# Patient Record
Sex: Female | Born: 1955 | Race: White | Hispanic: Yes | Marital: Married | Smoking: Never smoker
Health system: Southern US, Community
[De-identification: ages and names within clinical notes are randomized; demographics above are authoritative.]

## PROBLEM LIST (undated history)

## (undated) DIAGNOSIS — Z801 Family history of malignant neoplasm of trachea, bronchus and lung: Secondary | ICD-10-CM

## (undated) DIAGNOSIS — Z808 Family history of malignant neoplasm of other organs or systems: Secondary | ICD-10-CM

## (undated) DIAGNOSIS — F32A Depression, unspecified: Secondary | ICD-10-CM

## (undated) DIAGNOSIS — F329 Major depressive disorder, single episode, unspecified: Secondary | ICD-10-CM

## (undated) DIAGNOSIS — Z800 Family history of malignant neoplasm of digestive organs: Secondary | ICD-10-CM

## (undated) DIAGNOSIS — F419 Anxiety disorder, unspecified: Secondary | ICD-10-CM

## (undated) DIAGNOSIS — M1990 Unspecified osteoarthritis, unspecified site: Secondary | ICD-10-CM

## (undated) DIAGNOSIS — E785 Hyperlipidemia, unspecified: Secondary | ICD-10-CM

## (undated) DIAGNOSIS — Z803 Family history of malignant neoplasm of breast: Secondary | ICD-10-CM

## (undated) HISTORY — PX: COLONOSCOPY: SHX174

## (undated) HISTORY — PX: DILATION AND CURETTAGE OF UTERUS: SHX78

## (undated) HISTORY — PX: ABDOMINAL HYSTERECTOMY: SHX81

## (undated) HISTORY — DX: Family history of malignant neoplasm of breast: Z80.3

## (undated) HISTORY — PX: CHOLECYSTECTOMY: SHX55

## (undated) HISTORY — DX: Family history of malignant neoplasm of trachea, bronchus and lung: Z80.1

## (undated) HISTORY — DX: Family history of malignant neoplasm of digestive organs: Z80.0

## (undated) HISTORY — DX: Family history of malignant neoplasm of other organs or systems: Z80.8

---

## 2016-06-21 NOTE — H&P (Signed)
Shannon Griffith  DICTATION # [id number] CSN# [id number]   Margarette Asal, MD 06/21/2016 11:47 AM

## 2016-06-22 NOTE — H&P (Signed)
NAMETAKIMA, Shannon Griffith                ACCOUNT NO.:  [id number]  MEDICAL RECORD NO.:  [id number]  LOCATION:                                 FACILITY:  PHYSICIAN:  Ralene Bathe. Matthew Saras, M.D.DATE OF BIRTH:  1956/01/17  DATE OF ADMISSION: DATE OF DISCHARGE:                             HISTORY & PHYSICAL   CHIEF COMPLAINT:  Symptomatic uterine prolapse.  HISTORY OF PRESENT ILLNESS:  A 61 year old postmenopausal patient, G4, P4, who was seen in August of this year with complaints of increased vaginal pressure and a sensation that something was coming out.  She has no issue.  At the time of exam, she had moderate uterine prolapse with moderate cystocele and a mild low rectocele.  No other abnormalities noted.  We discussed a number of options for surgical correction of POP. She prefers the outlined plan of LAVH-BSO with A and P repair.  This procedure including specific risks related to bleeding, infection, adjacent organ injury, and the possible need to complete the surgery by open technique discussed.  Other risks related to wound infection, phlebitis along with her expected recovery time reviewed, which she understands and accepts.  PAST MEDICAL HISTORY:  Allergies, none.  CURRENT MEDICATIONS:  Seroquel and Wellbutrin.  PAST SURGICAL HISTORY:  She has had 4 vaginal deliveries and a cholecystectomy.  FAMILY HISTORY:  Significant for asthma and unspecified cancer.  SOCIAL HISTORY:  She denies alcohol, tobacco, or drug use.  She is married.  Dr. Billey Chang is her PCP.  Pap and mammogram are up to date through her PCP.  PHYSICAL EXAMINATION:  VITAL SIGNS:  Temperature 98.2, blood pressure 130/80. HEENT:  Unremarkable. NECK:  Supple without masses. LUNGS:  Clear. CARDIOVASCULAR:  Regular rate and rhythm without murmurs, rubs, or gallops noted. BREASTS:  Without masses. ABDOMEN:  Soft, flat, nontender.  On straining, there is a moderate uterine prolapse.  Uterus is otherwise  normal size.  Moderate cystocele, low-grade low rectocele.  Adnexae otherwise unremarkable.  IMPRESSION:  Symptomatic uterine prolapse with cystocele and small rectocele.  PLAN:  LAVH-BSO, A and P repair.  Procedure and risks reviewed as above.     Don Giarrusso M. Matthew Saras, M.D.   ______________________________ Ralene Bathe. Matthew Saras, M.D.    [facility]/MEDQ  D:  06/21/2016  T:  06/22/2016  Job:  [id number]

## 2016-06-28 NOTE — Patient Instructions (Addendum)
Your procedure is scheduled on:  Tuesday, Nov. 28, 2017  Enter through the Main Entrance of [facility] at:  6:00 AM  Pick up the phone at the desk and dial [phone].  Call this number if you have problems the morning of surgery: [phone].  Remember: Do NOT eat food or drink after:  Midnight Monday, Nov. 27, 2017  Take these medicines the morning of surgery with a SIP OF WATER:  None  Stop ALL herbal medications at this time   Do NOT wear jewelry (body piercing), metal hair clips/bobby pins, make-up, or nail polish. Do NOT wear lotions, powders, or perfumes.  You may wear deodorant. Do NOT shave for 48 hours prior to surgery. Do NOT bring valuables to the hospital. Contacts, dentures, or bridgework may not be worn into surgery.  Leave suitcase in car.  After surgery it may be brought to your room.  For patients admitted to the hospital, checkout time is 11:00 AM the day of discharge.  Bring a copy of healthcare power of attorney paperwork day of surgery.

## 2016-06-29 ENCOUNTER — Encounter (HOSPITAL_COMMUNITY)
Admission: RE | Admit: 2016-06-29 | Discharge: 2016-06-29 | Disposition: A | Discharge status: Home / Self Care | Payer: BLUE CROSS/BLUE SHIELD | Source: Ambulatory Visit | Attending: Obstetrics and Gynecology | Admitting: Obstetrics and Gynecology

## 2016-06-29 ENCOUNTER — Encounter (HOSPITAL_COMMUNITY): Payer: Self-pay

## 2016-06-29 ENCOUNTER — Other Ambulatory Visit: Payer: Self-pay

## 2016-06-29 DIAGNOSIS — Z01812 Encounter for preprocedural laboratory examination: Secondary | ICD-10-CM | POA: Diagnosis present

## 2016-06-29 HISTORY — DX: Depression, unspecified: F32.A

## 2016-06-29 HISTORY — DX: Anxiety disorder, unspecified: F41.9

## 2016-06-29 HISTORY — DX: Major depressive disorder, single episode, unspecified: F32.9

## 2016-06-29 HISTORY — DX: Unspecified osteoarthritis, unspecified site: M19.90

## 2016-06-29 LAB — TYPE AND SCREEN
ABO/RH(D): O NEG
Antibody Screen: NEGATIVE

## 2016-06-29 LAB — ABO/RH: ABO/RH(D): O NEG

## 2016-07-11 MED ORDER — CEFOTETAN DISODIUM 2 G IJ SOLR
2.0000 g | INTRAMUSCULAR | Status: AC
  Administered 2016-07-12: 2 g via INTRAVENOUS
  Filled 2016-07-11: qty 2

## 2016-07-12 ENCOUNTER — Encounter (HOSPITAL_COMMUNITY)
Admission: RE | Disposition: A | Discharge status: Home / Self Care | Payer: Self-pay | Source: Ambulatory Visit | Attending: Obstetrics and Gynecology

## 2016-07-12 ENCOUNTER — Encounter (HOSPITAL_COMMUNITY): Payer: Self-pay

## 2016-07-12 ENCOUNTER — Ambulatory Visit (HOSPITAL_COMMUNITY): Payer: BLUE CROSS/BLUE SHIELD | Admitting: Anesthesiology

## 2016-07-12 ENCOUNTER — Observation Stay (HOSPITAL_COMMUNITY)
Admission: RE | Admit: 2016-07-12 | Discharge: 2016-07-13 | Disposition: A | Discharge status: Home / Self Care | Payer: BLUE CROSS/BLUE SHIELD | Source: Ambulatory Visit | Attending: Obstetrics and Gynecology | Admitting: Obstetrics and Gynecology

## 2016-07-12 DIAGNOSIS — I350 Nonrheumatic aortic (valve) stenosis: Secondary | ICD-10-CM | POA: Diagnosis not present

## 2016-07-12 DIAGNOSIS — N8189 Other female genital prolapse: Principal | ICD-10-CM | POA: Insufficient documentation

## 2016-07-12 DIAGNOSIS — D251 Intramural leiomyoma of uterus: Secondary | ICD-10-CM | POA: Diagnosis not present

## 2016-07-12 DIAGNOSIS — N814 Uterovaginal prolapse, unspecified: Secondary | ICD-10-CM | POA: Diagnosis not present

## 2016-07-12 DIAGNOSIS — N819 Female genital prolapse, unspecified: Secondary | ICD-10-CM | POA: Diagnosis present

## 2016-07-12 DIAGNOSIS — N838 Other noninflammatory disorders of ovary, fallopian tube and broad ligament: Secondary | ICD-10-CM | POA: Diagnosis not present

## 2016-07-12 DIAGNOSIS — M1990 Unspecified osteoarthritis, unspecified site: Secondary | ICD-10-CM | POA: Diagnosis not present

## 2016-07-12 DIAGNOSIS — Z9889 Other specified postprocedural states: Secondary | ICD-10-CM

## 2016-07-12 HISTORY — PX: ANTERIOR AND POSTERIOR REPAIR WITH SACROSPINOUS FIXATION: SHX6536

## 2016-07-12 HISTORY — PX: CYSTOSCOPY: SHX5120

## 2016-07-12 HISTORY — PX: LAPAROSCOPIC VAGINAL HYSTERECTOMY WITH SALPINGO OOPHORECTOMY: SHX6681

## 2016-07-12 SURGERY — HYSTERECTOMY, VAGINAL, LAPAROSCOPY-ASSISTED, WITH SALPINGO-OOPHORECTOMY
Anesthesia: General

## 2016-07-12 MED ORDER — PROMETHAZINE HCL 25 MG/ML IJ SOLN: 6.2500 mg | INTRAMUSCULAR | Status: DC | PRN

## 2016-07-12 MED ORDER — FLUORESCEIN SODIUM 10 % IV SOLN
INTRAVENOUS | Status: DC | PRN
Start: 2016-07-12 — End: 2016-07-12
  Administered 2016-07-12: 100 mg via INTRAVENOUS

## 2016-07-12 MED ORDER — BUPROPION HCL ER (XL) 300 MG PO TB24
300.0000 mg | ORAL_TABLET | Freq: Every day | ORAL | Status: DC
  Administered 2016-07-12: 300 mg via ORAL
  Filled 2016-07-12 (×2): qty 1

## 2016-07-12 MED ORDER — OXYCODONE-ACETAMINOPHEN 5-325 MG PO TABS
1.0000 | ORAL_TABLET | ORAL | Status: DC | PRN
  Administered 2016-07-13: 1 via ORAL
  Filled 2016-07-12: qty 1

## 2016-07-12 MED ORDER — STERILE WATER FOR IRRIGATION IR SOLN
Status: DC | PRN
  Administered 2016-07-12: 1000 mL

## 2016-07-12 MED ORDER — BUPIVACAINE HCL (PF) 0.25 % IJ SOLN
INTRAMUSCULAR | Status: AC
  Filled 2016-07-12: qty 30

## 2016-07-12 MED ORDER — HYDROMORPHONE HCL 1 MG/ML IJ SOLN
0.2500 mg | INTRAMUSCULAR | Status: DC | PRN
  Administered 2016-07-12 (×2): 0.5 mg via INTRAVENOUS

## 2016-07-12 MED ORDER — EPHEDRINE 5 MG/ML INJ
INTRAVENOUS | Status: AC
  Filled 2016-07-12: qty 10

## 2016-07-12 MED ORDER — KETOROLAC TROMETHAMINE 30 MG/ML IJ SOLN
INTRAMUSCULAR | Status: DC | PRN
Start: 2016-07-12 — End: 2016-07-12
  Administered 2016-07-12: 30 mg via INTRAVENOUS

## 2016-07-12 MED ORDER — MIDAZOLAM HCL 2 MG/2ML IJ SOLN
INTRAMUSCULAR | Status: DC | PRN
  Administered 2016-07-12: 2 mg via INTRAVENOUS

## 2016-07-12 MED ORDER — MORPHINE SULFATE 2 MG/ML IV SOLN
INTRAVENOUS | Status: DC
  Administered 2016-07-12 (×2): 5 mg via INTRAVENOUS
  Administered 2016-07-12: 2 mg via INTRAVENOUS
  Administered 2016-07-12: 11:00:00 via INTRAVENOUS
  Administered 2016-07-13: 2 mg via INTRAVENOUS
  Administered 2016-07-13: 1 mg via INTRAVENOUS
  Filled 2016-07-12: qty 25

## 2016-07-12 MED ORDER — KETOROLAC TROMETHAMINE 30 MG/ML IJ SOLN: 30.0000 mg | Freq: Four times a day (QID) | INTRAMUSCULAR | Status: DC

## 2016-07-12 MED ORDER — ONDANSETRON HCL 4 MG/2ML IJ SOLN: 4.0000 mg | Freq: Four times a day (QID) | INTRAMUSCULAR | Status: DC | PRN

## 2016-07-12 MED ORDER — NALOXONE HCL 0.4 MG/ML IJ SOLN: 0.4000 mg | INTRAMUSCULAR | Status: DC | PRN

## 2016-07-12 MED ORDER — PHENYLEPHRINE 40 MCG/ML (10ML) SYRINGE FOR IV PUSH (FOR BLOOD PRESSURE SUPPORT)
PREFILLED_SYRINGE | INTRAVENOUS | Status: AC
  Filled 2016-07-12: qty 10

## 2016-07-12 MED ORDER — ONDANSETRON HCL 4 MG PO TABS: 4.0000 mg | ORAL_TABLET | Freq: Four times a day (QID) | ORAL | Status: DC | PRN

## 2016-07-12 MED ORDER — ROCURONIUM BROMIDE 100 MG/10ML IV SOLN
INTRAVENOUS | Status: AC
  Filled 2016-07-12: qty 1

## 2016-07-12 MED ORDER — DEXAMETHASONE SODIUM PHOSPHATE 10 MG/ML IJ SOLN
INTRAMUSCULAR | Status: DC | PRN
  Administered 2016-07-12: 10 mg via INTRAVENOUS

## 2016-07-12 MED ORDER — LACTATED RINGERS IV SOLN
INTRAVENOUS | Status: DC
  Administered 2016-07-12 (×3): via INTRAVENOUS

## 2016-07-12 MED ORDER — PROPOFOL 10 MG/ML IV BOLUS
INTRAVENOUS | Status: DC | PRN
  Administered 2016-07-12: 150 mg via INTRAVENOUS

## 2016-07-12 MED ORDER — SUCCINYLCHOLINE CHLORIDE 20 MG/ML IJ SOLN
INTRAMUSCULAR | Status: DC | PRN
  Administered 2016-07-12: 50 mg via INTRAVENOUS

## 2016-07-12 MED ORDER — PROPOFOL 10 MG/ML IV BOLUS
INTRAVENOUS | Status: AC
  Filled 2016-07-12: qty 40

## 2016-07-12 MED ORDER — EPHEDRINE SULFATE 50 MG/ML IJ SOLN
INTRAMUSCULAR | Status: DC | PRN
  Administered 2016-07-12: 10 mg via INTRAVENOUS
  Administered 2016-07-12 (×2): 5 mg via INTRAVENOUS
  Administered 2016-07-12 (×2): 20 mg via INTRAVENOUS

## 2016-07-12 MED ORDER — KETOROLAC TROMETHAMINE 30 MG/ML IJ SOLN: 30.0000 mg | Freq: Once | INTRAMUSCULAR | Status: DC

## 2016-07-12 MED ORDER — BUPIVACAINE HCL (PF) 0.25 % IJ SOLN
INTRAMUSCULAR | Status: DC | PRN
  Administered 2016-07-12: 6 mL

## 2016-07-12 MED ORDER — DEXTROSE IN LACTATED RINGERS 5 % IV SOLN
INTRAVENOUS | Status: DC
  Administered 2016-07-12 (×2): via INTRAVENOUS

## 2016-07-12 MED ORDER — BUTORPHANOL TARTRATE 1 MG/ML IJ SOLN: 1.0000 mg | INTRAMUSCULAR | Status: DC | PRN

## 2016-07-12 MED ORDER — LIDOCAINE HCL (CARDIAC) 20 MG/ML IV SOLN
INTRAVENOUS | Status: DC | PRN
  Administered 2016-07-12: 30 mg via INTRAVENOUS

## 2016-07-12 MED ORDER — MIDAZOLAM HCL 2 MG/2ML IJ SOLN
INTRAMUSCULAR | Status: AC
  Filled 2016-07-12: qty 2

## 2016-07-12 MED ORDER — SCOPOLAMINE 1 MG/3DAYS TD PT72
1.0000 | MEDICATED_PATCH | Freq: Once | TRANSDERMAL | Status: AC
  Administered 2016-07-12: 1 via TRANSDERMAL

## 2016-07-12 MED ORDER — ESTRADIOL 0.1 MG/GM VA CREA
TOPICAL_CREAM | VAGINAL | Status: AC
  Filled 2016-07-12: qty 42.5

## 2016-07-12 MED ORDER — GLYCOPYRROLATE 0.2 MG/ML IJ SOLN
INTRAMUSCULAR | Status: DC | PRN
  Administered 2016-07-12: 0.2 mg via INTRAVENOUS

## 2016-07-12 MED ORDER — FAMOTIDINE 20 MG PO TABS
20.0000 mg | ORAL_TABLET | Freq: Two times a day (BID) | ORAL | Status: DC | PRN
  Administered 2016-07-12: 20 mg via ORAL
  Filled 2016-07-12: qty 1

## 2016-07-12 MED ORDER — ONDANSETRON HCL 4 MG/2ML IJ SOLN
INTRAMUSCULAR | Status: DC | PRN
  Administered 2016-07-12: 4 mg via INTRAVENOUS

## 2016-07-12 MED ORDER — KETOROLAC TROMETHAMINE 30 MG/ML IJ SOLN
INTRAMUSCULAR | Status: AC
  Filled 2016-07-12: qty 1

## 2016-07-12 MED ORDER — SUGAMMADEX SODIUM 200 MG/2ML IV SOLN
INTRAVENOUS | Status: AC
  Filled 2016-07-12: qty 2

## 2016-07-12 MED ORDER — IBUPROFEN 800 MG PO TABS: 800.0000 mg | ORAL_TABLET | Freq: Three times a day (TID) | ORAL | Status: DC | PRN

## 2016-07-12 MED ORDER — SODIUM CHLORIDE 0.9 % IJ SOLN
INTRAMUSCULAR | Status: AC
  Filled 2016-07-12: qty 10

## 2016-07-12 MED ORDER — MENTHOL 3 MG MT LOZG: 1.0000 | LOZENGE | OROMUCOSAL | Status: DC | PRN

## 2016-07-12 MED ORDER — DIPHENHYDRAMINE HCL 50 MG/ML IJ SOLN: 12.5000 mg | Freq: Four times a day (QID) | INTRAMUSCULAR | Status: DC | PRN

## 2016-07-12 MED ORDER — SODIUM CHLORIDE 0.9 % IJ SOLN
INTRAMUSCULAR | Status: DC | PRN
  Administered 2016-07-12: 10 mL

## 2016-07-12 MED ORDER — QUETIAPINE FUMARATE ER 50 MG PO TB24
150.0000 mg | ORAL_TABLET | Freq: Every day | ORAL | Status: DC
  Administered 2016-07-12: 150 mg via ORAL
  Filled 2016-07-12 (×2): qty 3

## 2016-07-12 MED ORDER — LIDOCAINE-EPINEPHRINE (PF) 1 %-1:200000 IJ SOLN
INTRAMUSCULAR | Status: DC | PRN
  Administered 2016-07-12: 6 mL

## 2016-07-12 MED ORDER — PHENYLEPHRINE HCL 10 MG/ML IJ SOLN
INTRAMUSCULAR | Status: DC | PRN
  Administered 2016-07-12 (×4): .04 mg via INTRAVENOUS

## 2016-07-12 MED ORDER — LIDOCAINE-EPINEPHRINE (PF) 1 %-1:200000 IJ SOLN
INTRAMUSCULAR | Status: AC
  Filled 2016-07-12: qty 30

## 2016-07-12 MED ORDER — FENTANYL CITRATE (PF) 250 MCG/5ML IJ SOLN
INTRAMUSCULAR | Status: AC
  Filled 2016-07-12: qty 5

## 2016-07-12 MED ORDER — ROCURONIUM BROMIDE 100 MG/10ML IV SOLN
INTRAVENOUS | Status: DC | PRN
  Administered 2016-07-12: 20 mg via INTRAVENOUS

## 2016-07-12 MED ORDER — SODIUM CHLORIDE 0.9% FLUSH: 9.0000 mL | INTRAVENOUS | Status: DC | PRN

## 2016-07-12 MED ORDER — SUGAMMADEX SODIUM 200 MG/2ML IV SOLN
INTRAVENOUS | Status: DC | PRN
  Administered 2016-07-12: 142.6 mg via INTRAVENOUS

## 2016-07-12 MED ORDER — SCOPOLAMINE 1 MG/3DAYS TD PT72
MEDICATED_PATCH | TRANSDERMAL | Status: AC
  Filled 2016-07-12: qty 1

## 2016-07-12 MED ORDER — FENTANYL CITRATE (PF) 100 MCG/2ML IJ SOLN
INTRAMUSCULAR | Status: DC | PRN
  Administered 2016-07-12: 100 ug via INTRAVENOUS
  Administered 2016-07-12: 50 ug via INTRAVENOUS

## 2016-07-12 MED ORDER — HYDROMORPHONE HCL 1 MG/ML IJ SOLN
INTRAMUSCULAR | Status: AC
  Administered 2016-07-12: 0.5 mg via INTRAVENOUS
  Filled 2016-07-12: qty 1

## 2016-07-12 MED ORDER — ESTRADIOL 0.1 MG/GM VA CREA
TOPICAL_CREAM | VAGINAL | Status: DC | PRN
  Administered 2016-07-12: 1 via VAGINAL

## 2016-07-12 MED ORDER — KETOROLAC TROMETHAMINE 30 MG/ML IJ SOLN
30.0000 mg | Freq: Four times a day (QID) | INTRAMUSCULAR | Status: DC
  Administered 2016-07-12 – 2016-07-13 (×4): 30 mg via INTRAVENOUS
  Filled 2016-07-12 (×4): qty 1

## 2016-07-12 MED ORDER — FLUORESCEIN SODIUM 10 % IV SOLN
INTRAVENOUS | Status: AC
  Filled 2016-07-12: qty 5

## 2016-07-12 MED ORDER — LIDOCAINE HCL (CARDIAC) 20 MG/ML IV SOLN
INTRAVENOUS | Status: AC
  Filled 2016-07-12: qty 5

## 2016-07-12 MED ORDER — DIPHENHYDRAMINE HCL 12.5 MG/5ML PO ELIX: 12.5000 mg | ORAL_SOLUTION | Freq: Four times a day (QID) | ORAL | Status: DC | PRN

## 2016-07-12 MED ORDER — FAMOTIDINE 40 MG/5ML PO SUSR: 20.0000 mg | Freq: Two times a day (BID) | ORAL | Status: DC | PRN

## 2016-07-12 SURGICAL SUPPLY — 64 items
BLADE SURG 11 STRL SS (BLADE) ×3 IMPLANT
BLADE SURG 15 STRL LF C SS BP (BLADE) IMPLANT
BLADE SURG 15 STRL SS (BLADE)
CABLE HIGH FREQUENCY MONO STRZ (ELECTRODE) IMPLANT
CATH ROBINSON RED A/P 16FR (CATHETERS) ×3 IMPLANT
CLOTH BEACON ORANGE TIMEOUT ST (SAFETY) ×3 IMPLANT
CONT PATH 16OZ SNAP LID 3702 (MISCELLANEOUS) ×3 IMPLANT
COUNTER NEEDLE 1200 MAGNETIC (NEEDLE) ×3 IMPLANT
COVER BACK TABLE 60X90IN (DRAPES) ×3 IMPLANT
DECANTER SPIKE VIAL GLASS SM (MISCELLANEOUS) ×9 IMPLANT
DERMABOND ADVANCED (GAUZE/BANDAGES/DRESSINGS) ×1
DERMABOND ADVANCED .7 DNX12 (GAUZE/BANDAGES/DRESSINGS) ×2 IMPLANT
DEVICE CAPIO SLIM SINGLE (INSTRUMENTS) ×3 IMPLANT
DRSG OPSITE POSTOP 3X4 (GAUZE/BANDAGES/DRESSINGS) IMPLANT
DURAPREP 26ML APPLICATOR (WOUND CARE) ×3 IMPLANT
ELECT LIGASURE SHORT 9 REUSE (ELECTRODE) ×3 IMPLANT
ELECT REM PT RETURN 9FT ADLT (ELECTROSURGICAL) ×3
ELECTRODE REM PT RTRN 9FT ADLT (ELECTROSURGICAL) ×2 IMPLANT
GAUZE PACKING 2X5 YD STRL (GAUZE/BANDAGES/DRESSINGS) ×3 IMPLANT
GLOVE BIO SURGEON STRL SZ7 (GLOVE) ×6 IMPLANT
GLOVE BIOGEL PI IND STRL 6.5 (GLOVE) ×2 IMPLANT
GLOVE BIOGEL PI IND STRL 7.0 (GLOVE) ×4 IMPLANT
GLOVE BIOGEL PI INDICATOR 6.5 (GLOVE) ×1
GLOVE BIOGEL PI INDICATOR 7.0 (GLOVE) ×2
GOWN STRL REUS W/TWL LRG LVL3 (GOWN DISPOSABLE) ×12 IMPLANT
LEGGING LITHOTOMY PAIR STRL (DRAPES) ×3 IMPLANT
NEEDLE HYPO 22GX1.5 SAFETY (NEEDLE) IMPLANT
NEEDLE INSUFFLATION 120MM (ENDOMECHANICALS) ×3 IMPLANT
NEEDLE MAYO 6 CRC TAPER PT (NEEDLE) ×3 IMPLANT
NS IRRIG 1000ML POUR BTL (IV SOLUTION) ×3 IMPLANT
PACK LAVH (CUSTOM PROCEDURE TRAY) ×3 IMPLANT
PACK ROBOTIC GOWN (GOWN DISPOSABLE) ×3 IMPLANT
PACK TRENDGUARD 450 HYBRID PRO (MISCELLANEOUS) ×2 IMPLANT
PACK TRENDGUARD 600 HYBRD PROC (MISCELLANEOUS) IMPLANT
PACK VAGINAL WOMENS (CUSTOM PROCEDURE TRAY) ×3 IMPLANT
PROTECTOR NERVE ULNAR (MISCELLANEOUS) ×6 IMPLANT
SEALER TISSUE G2 CVD JAW 45CM (ENDOMECHANICALS) ×3 IMPLANT
SET CYSTO W/LG BORE CLAMP LF (SET/KITS/TRAYS/PACK) ×3 IMPLANT
SET IRRIG TUBING LAPAROSCOPIC (IRRIGATION / IRRIGATOR) IMPLANT
SLEEVE XCEL OPT CAN 5 100 (ENDOMECHANICALS) ×3 IMPLANT
SUT CAPIO POLYGLYCOLIC (SUTURE) ×6 IMPLANT
SUT MON AB 2-0 CT1 36 (SUTURE) ×6 IMPLANT
SUT MON AB 3-0 SH 27 (SUTURE)
SUT MON AB 3-0 SH27 (SUTURE) IMPLANT
SUT VIC AB 0 CT1 18XCR BRD8 (SUTURE) ×6 IMPLANT
SUT VIC AB 0 CT1 36 (SUTURE) ×3 IMPLANT
SUT VIC AB 0 CT1 8-18 (SUTURE) ×3
SUT VIC AB 2-0 CT1 27 (SUTURE)
SUT VIC AB 2-0 CT1 TAPERPNT 27 (SUTURE) IMPLANT
SUT VIC AB 2-0 CT2 27 (SUTURE) ×18 IMPLANT
SUT VIC AB 4-0 PS2 27 (SUTURE) ×3 IMPLANT
SUT VICRYL 0 TIES 12 18 (SUTURE) ×3 IMPLANT
SUT VICRYL 0 UR6 27IN ABS (SUTURE) ×3 IMPLANT
SUT VICRYL 4-0 PS2 18IN ABS (SUTURE) ×3 IMPLANT
SUT VICRYL RAPIDE 3 0 (SUTURE) ×9 IMPLANT
SUT VICRYL RAPIDE 3-0 36IN (SUTURE) ×3 IMPLANT
SYR 30ML LL (SYRINGE) ×3 IMPLANT
TOWEL OR 17X24 6PK STRL BLUE (TOWEL DISPOSABLE) ×6 IMPLANT
TRAY FOLEY CATH SILVER 14FR (SET/KITS/TRAYS/PACK) ×3 IMPLANT
TRENDGUARD 450 HYBRID PRO PACK (MISCELLANEOUS) ×3
TRENDGUARD 600 HYBRID PROC PK (MISCELLANEOUS)
TROCAR OPTI TIP 5M 100M (ENDOMECHANICALS) ×3 IMPLANT
TROCAR XCEL DIL TIP R 11M (ENDOMECHANICALS) ×3 IMPLANT
WARMER LAPAROSCOPE (MISCELLANEOUS) ×3 IMPLANT

## 2016-07-12 NOTE — Anesthesia Preprocedure Evaluation (Addendum)
Anesthesia Evaluation  Patient identified by MRN, date of birth, ID band Patient awake    Reviewed: Allergy & Precautions, NPO status , reviewed documented beta blocker date and time   Airway Mallampati: II  TM Distance: >3 FB     Dental   Pulmonary    breath sounds clear to auscultation       Cardiovascular negative cardio ROS   Rhythm:Regular Rate:Normal     Neuro/Psych    GI/Hepatic negative GI ROS, Neg liver ROS,   Endo/Other  negative endocrine ROS  Renal/GU negative Renal ROS     Musculoskeletal  (+) Arthritis ,   Abdominal   Peds  Hematology   Anesthesia Other Findings   Reproductive/Obstetrics                            Anesthesia Physical Anesthesia Plan  ASA: II  Anesthesia Plan:    Post-op Pain Management:    Induction: Intravenous  Airway Management Planned: Oral ETT  Additional Equipment:   Intra-op Plan:   Post-operative Plan:   Informed Consent: I have reviewed the patients History and Physical, chart, labs and discussed the procedure including the risks, benefits and alternatives for the proposed anesthesia with the patient or authorized representative who has indicated his/her understanding and acceptance.   Dental advisory given  Plan Discussed with: CRNA and Anesthesiologist  Anesthesia Plan Comments:         Anesthesia Quick Evaluation

## 2016-07-12 NOTE — Progress Notes (Signed)
No change to exam>>aortic stenosis discussed in office last week , added poss SSLS to consent, along w/ cystoscopy

## 2016-07-12 NOTE — Anesthesia Procedure Notes (Signed)
Procedure Name: Intubation Date/Time: 07/12/2016 7:35 AM Performed by: Rayvon Char Pre-anesthesia Checklist: Patient identified, Emergency Drugs available, Suction available and Patient being monitored Patient Re-evaluated:Patient Re-evaluated prior to inductionOxygen Delivery Method: Circle system utilized Preoxygenation: Pre-oxygenation with 100% oxygen Intubation Type: IV induction Ventilation: Mask ventilation without difficulty Laryngoscope Size: Glidescope and 3 Grade View: Grade III Number of attempts: 1 Airway Equipment and Method: Video-laryngoscopy Placement Confirmation: ETT inserted through vocal cords under direct vision,  positive ETCO2 and breath sounds checked- equal and bilateral Secured at: 22 cm Tube secured with: Tape Dental Injury: Teeth and Oropharynx as per pre-operative assessment  Difficulty Due To: Difficult Airway- due to anterior larynx

## 2016-07-12 NOTE — Transfer of Care (Signed)
Immediate Anesthesia Transfer of Care Note  Patient: Avari Berkner  Procedure(s) Performed: Procedure(s): LAPAROSCOPIC ASSISTED VAGINAL HYSTERECTOMY WITH SALPINGO OOPHORECTOMY (Bilateral) ANTERIOR AND POSTERIOR REPAIR WITH SACROSPINOUS FIXATION (N/A) CYSTOSCOPY (N/A)  Patient Location: PACU  Anesthesia Type:General  Level of Consciousness: awake, alert  and oriented  Airway & Oxygen Therapy: Patient Spontanous Breathing and Patient connected to nasal cannula oxygen  Post-op Assessment: Report given to RN and Post -op Vital signs reviewed and stable  Post vital signs: Reviewed and stable  Last Vitals:  Vitals:   07/12/16 0607  BP: 117/75  Pulse: 77  Resp: 18  Temp: 36.5 C    Last Pain:  Vitals:   07/12/16 0607  TempSrc: Oral      Patients Stated Pain Goal: 3 ([id number])  Complications: No apparent anesthesia complications

## 2016-07-12 NOTE — Addendum Note (Signed)
Addendum  created 07/12/16 1303 by Raenette Rover, CRNA   Sign clinical note

## 2016-07-12 NOTE — Anesthesia Postprocedure Evaluation (Signed)
Anesthesia Post Note  Patient: Shannon Griffith  Procedure(s) Performed: Procedure(s) (LRB): LAPAROSCOPIC ASSISTED VAGINAL HYSTERECTOMY WITH SALPINGO OOPHORECTOMY (Bilateral) ANTERIOR AND POSTERIOR REPAIR WITH SACROSPINOUS FIXATION (N/A) CYSTOSCOPY (N/A)  Anesthesia Type: General Pain management: pain level controlled Respiratory status: spontaneous breathing Cardiovascular status: stable Anesthetic complications: no     Last Vitals:  Vitals:   07/12/16 1040 07/12/16 1048  BP:    Pulse: 75 79  Resp: 11 12  Temp:      Last Pain:  Vitals:   07/12/16 0607  TempSrc: Oral   Pain Goal: Patients Stated Pain Goal: 3 (07/12/16 [id number])               EDWARDS,Marlaya Turck

## 2016-07-12 NOTE — Anesthesia Postprocedure Evaluation (Signed)
Anesthesia Post Note  Patient: Shannon Griffith  Procedure(s) Performed: Procedure(s) (LRB): LAPAROSCOPIC ASSISTED VAGINAL HYSTERECTOMY WITH SALPINGO OOPHORECTOMY (Bilateral) ANTERIOR AND POSTERIOR REPAIR WITH SACROSPINOUS FIXATION (N/A) CYSTOSCOPY (N/A)  Patient location during evaluation: [facility] Anesthesia Type: General Level of consciousness: awake, awake and alert, oriented and patient cooperative Pain management: pain level controlled Vital Signs Assessment: post-procedure vital signs reviewed and stable Respiratory status: spontaneous breathing, nonlabored ventilation, respiratory function stable and patient connected to nasal cannula oxygen Cardiovascular status: stable Postop Assessment: no headache, no backache and no signs of nausea or vomiting Anesthetic complications: no     Last Vitals:  Vitals:   07/12/16 1200 07/12/16 1205  BP: (!) 97/49   Pulse: 76   Resp: 11 17  Temp: 36.6 C     Last Pain:  Vitals:   07/12/16 1205  TempSrc:   PainSc: 2    Pain Goal: Patients Stated Pain Goal: 3 (07/12/16 1100)               Autumnrose Yore L

## 2016-07-12 NOTE — Op Note (Signed)
Preoperative diagnosis: Pelvic organ prolapse, cystocele rectocele, uterine prolapse  Postoperative diagnosis: Same  Procedure: LAVH, BSO, anterior posterior repair, sacrospinous ligament fixation, cystoscopy  Surgeon: Matthew Saras  Assistant: McComb  EBL: 200 cc  Procedure and findings:  Patient taken the operating room after an adequate level of general anesthesia was obtained the patient's legs in stirrups the abdomen perineum and vagina were prepped and draped, the bladder was drained, moderate uterine prolapse was noted along with cystocele rectocele her bimanual exam was unremarkable. Hulka tenaculum was positioned. This was done after appropriate timeouts were taken.  Attention directed to the abdomen the subumbilical area was infiltrated with quarter percent Marcaine plain, the varies needle was introduced that difficulty after small incision was made. Its intra-abdominal position verified by pressure water testing. After a 3 L pneumoperitoneum syncopated, lap scopic trocar and sleeve were then introduced without difficulty. There was no evidence of any bleeding or trauma. 3 finger breaths above the symphysis in the midline a 5 mm trocar was inserted under direct visualization, the patient was then placed in Trendelenburg and the pelvic findings as follows:  The uterus itself was normal size, mobile anterior posterior cul-de-sac spaces were free and clear upper abdomen negative bilateral tubes and ovaries were normal. Atraumatic grasping forcep was then positioned placed in the right tube and ovary on traction toward the midline, the Enseal device was then used to coagulate and divide the right IP ligament down to and including the round ligament on the right after assuring that the ureter was well below. The exact same repeated on the opposite side. Once this was completed the vaginal portion the procedure was started  Legs were extended, weighted speculum was positioned the cervical vaginal  mucosa was incised, posterior culdotomy performed without difficulty, the bladder was advanced superiorly with sharp and blunt dissection until the anterior peritoneal reflection could be identified, this was then entered sharply and a retractor was then used to gently elevate the bladder out of the field. In sequential manner the uterosacral ligament cardinal ligament uterine vasculature pedicle and upper broad ligament pedicles were clamped divided and suture ligated with 0 Vicryl fundus of the uterus is in delivered posteriorly remaining pedicles were clamped divided and free tied. Once this was completed the vaginal cuff was enclose with a locked 2-0 Vicryl suture from 3 to 9:00 this was hemostatic McCall's culdoplasty suture was in position picking up left uterosacral ligament posterior peritoneum across to the right side and tied down for extra posterior support. Prior to closure sponge, needle, history counts reported as correct 2 vaginal mucosa then closed right to left with interrupted 2-0 Vicryl sutures. The UV angle was then grasped with a Allis clamp, the vaginal mucosa anteriorly was divided in the midline up to the UV angle. Using sharp and blunt dissection the perivesical fascia was separated from the vaginal mucosa and the cystocele was reduced. Perivesical fascia was then plicated in midline with interrupted 2-0 Vicryl sutures. Excess mucosa was trimmed and then closed with interrupted 2-0 Vicryl sutures. Posteriorly, a small triangle of perineal skin was excised the posterior mucosa was divided two thirds the way up to the cuff, using sharp and blunt dissection the rectocele was reduced, easy exposure of the right ischial spine and sacrospinous ligament area. The Area device with the Vicryl was then positioned 2 finger breaths from the initial spine and the suture was passed, this was anchored to the posterior mucosa near the cuff and held temporarily. The perirectal fascia was then  plicated in  the midline with 2-0 Vicryl sutures. A small amount of excess mucosa was then trimmed. The posterior vaginal Coso was then closed with interrupted 2-0 Vicryl sutures, several were placed at the top before tying down the 6 minus ligament suture with good elevation of the vaginal cuff. The remainder of the mucosa was then closed, the perineum closed with 3-0 Vicryl repeat sutures. She been given for seen IV, cystoscopy was then carried out revealing the bladder to be intact, good ureteral jets from both sides was noted, Foley catheter was placed straight drain vaginal pack was position.  Repeat laparoscopy was carried out at reduced pressure revealing the operative site to be hemostatic very minimal old blood, otherwise hemostatic. Inserts removed, gas allowed to escape, the umbilical incision closed with a 4-0 Monocryl subcuticular Dermabond on the lower she tolerated this well went to recovery room in good condition.  Dictated with Midway[employer].

## 2016-07-13 ENCOUNTER — Encounter (HOSPITAL_COMMUNITY): Payer: Self-pay

## 2016-07-13 ENCOUNTER — Observation Stay (HOSPITAL_COMMUNITY): Payer: BLUE CROSS/BLUE SHIELD

## 2016-07-13 DIAGNOSIS — N8189 Other female genital prolapse: Secondary | ICD-10-CM | POA: Diagnosis not present

## 2016-07-13 LAB — CBC
HEMATOCRIT: 31.9 % — AB (ref 36.0–46.0)
Hemoglobin: 10.4 g/dL — ABNORMAL LOW (ref 12.0–15.0)
MCH: 29 pg (ref 26.0–34.0)
MCHC: 32.6 g/dL (ref 30.0–36.0)
MCV: 88.9 fL (ref 78.0–100.0)
PLATELETS: 207 10*3/uL (ref 150–400)
RBC: 3.59 MIL/uL — AB (ref 3.87–5.11)
RDW: 13.7 % (ref 11.5–15.5)
WBC: 9.5 10*3/uL (ref 4.0–10.5)

## 2016-07-13 MED ORDER — IBUPROFEN 800 MG PO TABS: 800.0000 mg | ORAL_TABLET | Freq: Three times a day (TID) | ORAL | 1 refills | Status: DC | PRN

## 2016-07-13 MED ORDER — OXYCODONE-ACETAMINOPHEN 5-325 MG PO TABS: 1.0000 | ORAL_TABLET | ORAL | 0 refills | Status: DC | PRN

## 2016-07-13 NOTE — Progress Notes (Signed)
Wasted 9ml of 2mg /33ml morphine in sink. Witnessed by Vonzella Nipple RN.

## 2016-07-13 NOTE — Discharge Summary (Signed)
Physician Discharge Summary  Patient ID: Shannon Griffith MRN: [id number] DOB/AGE: 03-08-1956 60 y.o.  Admit date: 07/12/2016 Discharge date: 07/13/2016  Admission Diagnoses:POP, uterine prolapse/cystocele/rectocele  Discharge Diagnoses: same Active Problems:   Pelvic prolapse   Discharged Condition: good  Hospital Course: adm for LAVH BSO A+P repair and SSLS w/ cysto>>on POD #1, cystogram WNL and foley D/C, afeb, tol PO and ready for D/C  Consults: None  Significant Diagnostic Studies: labs:  CBC    Component Value Date/Time   WBC 9.5 07/13/2016 0605   RBC 3.59 (L) 07/13/2016 0605   HGB 10.4 (L) 07/13/2016 0605   HCT 31.9 (L) 07/13/2016 0605   PLT 207 07/13/2016 0605   MCV 88.9 07/13/2016 0605   MCH 29.0 07/13/2016 0605   MCHC 32.6 07/13/2016 0605   RDW 13.7 07/13/2016 0605     Treatments: surgery: LAVH BSO, A and P repair,SSLS, cystoscopy  Discharge Exam: Blood pressure 120/63, pulse 70, temperature 98 F (36.7 C), temperature source Oral, resp. rate 18, height 5\' 6"  (1.676 m), weight 69.4 kg (153 lb), SpO2 98 %. abd soft, flat + BS, Inc C/D  Disposition: Final discharge disposition not confirmed     Medication List    TAKE these medications   buPROPion 300 MG 24 hr tablet Commonly known as:  WELLBUTRIN XL Take 300 mg by mouth at bedtime.   ibuprofen 800 MG tablet Commonly known as:  ADVIL,MOTRIN Take 1 tablet (800 mg total) by mouth every 8 (eight) hours as needed for moderate pain (mild pain). Start taking on:  07/17/2016   oxyCODONE-acetaminophen 5-325 MG tablet Commonly known as:  PERCOCET/ROXICET Take 1-2 tablets by mouth every 4 (four) hours as needed for severe pain (moderate to severe pain (when tolerating fluids)).   QUEtiapine Fumarate 150 MG 24 hr tablet Commonly known as:  SEROQUEL XR Take 150 mg by mouth at bedtime.      Follow-up Information    Margarette Asal, MD. Schedule an appointment as soon as possible for a visit in 1 week(s).    Specialty:  Obstetrics and Gynecology Contact information: [address] [place] [address] [phone]           Signed: Margarette Asal 07/13/2016, 1:13 PM

## 2016-07-13 NOTE — Progress Notes (Signed)
Patient discharged home with daughter and husband. Discharge paperwork, instructions, follow-up appts, and prescriptions reviewed with patient and daughter. No questions at this time. Prescriptions given to patient.

## 2016-07-13 NOTE — Progress Notes (Signed)
Cystogram WNL>>cath removed for voiding trial, if voiding OK>>will D/C

## 2017-05-12 IMAGING — RF DG CYSTOGRAM 3+V
14 series · 14 of 14 positions shown · non-contrast
Comparison: None

CLINICAL DATA: Post hysterectomy.  Evaluate bladder integrity.

EXAM:
CYSTOGRAM
TECHNIQUE: After catheterization of the urinary bladder following sterile
technique the bladder was filled with 250 mL Cysto-Hypaque 30% by
drip infusion. Serial spot images were obtained during bladder
filling and post draining.
FLUOROSCOPY TIME:  Fluoroscopy Time:  1 minutes
Radiation Exposure Index (if provided by the fluoroscopic device):
Number of Acquired Spot Images: 8

[Series 1: run · 1 of 1 slices shown (1 of 14)]
[im 1/1]
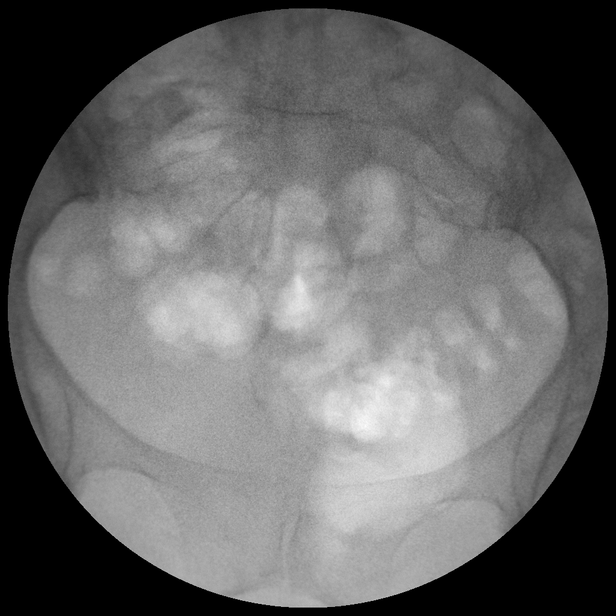

[Series 2: run · 1 of 1 slices shown (2 of 14)]
[im 1/1]
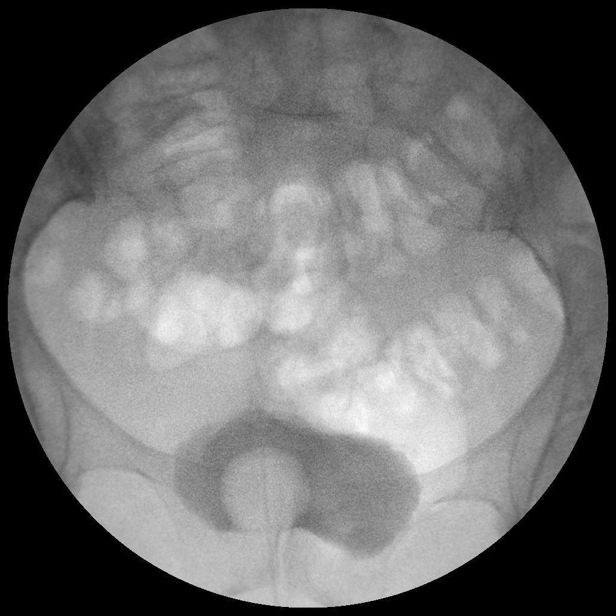

[Series 3: run · 1 of 1 slices shown (3 of 14)]
[im 1/1]
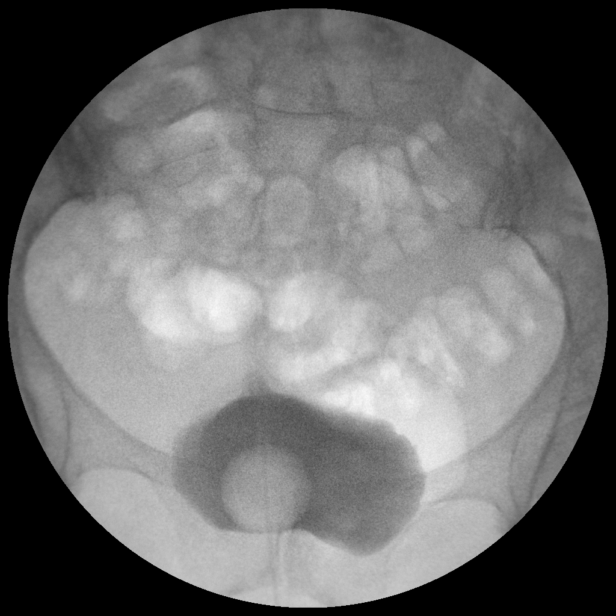

[Series 4: run · 1 of 1 slices shown (4 of 14)]
[im 1/1]
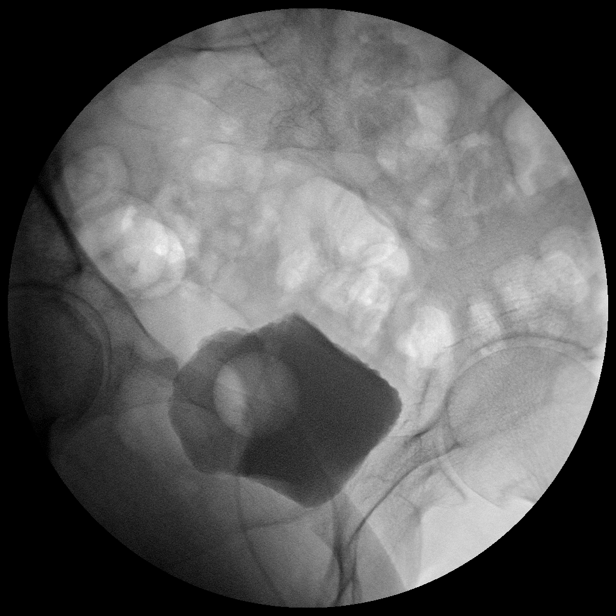

[Series 5: run · 1 of 1 slices shown (5 of 14)]
[im 1/1]
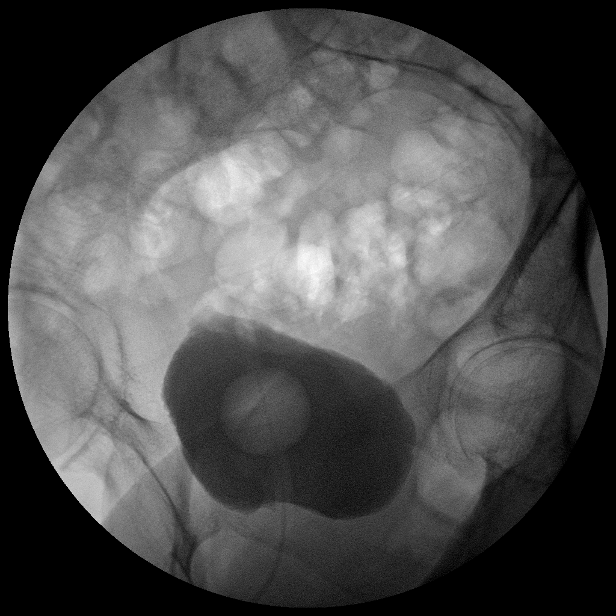

[Series 6: run · 1 of 1 slices shown (6 of 14)]
[im 1/1]
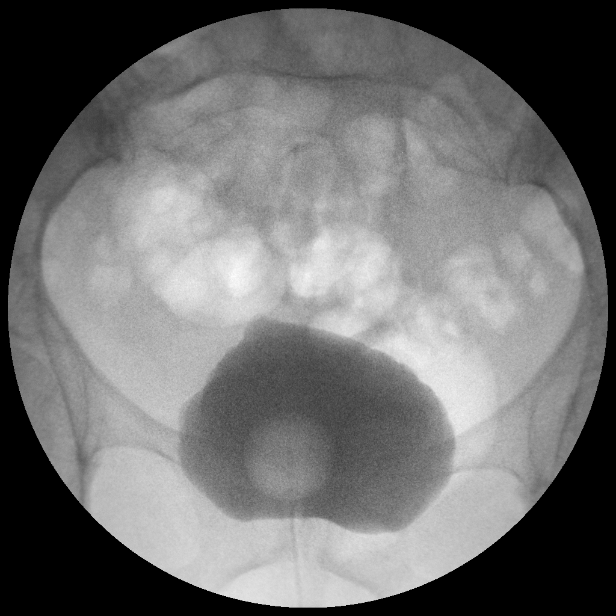

[Series 7: run · 1 of 1 slices shown (7 of 14)]
[im 1/1]
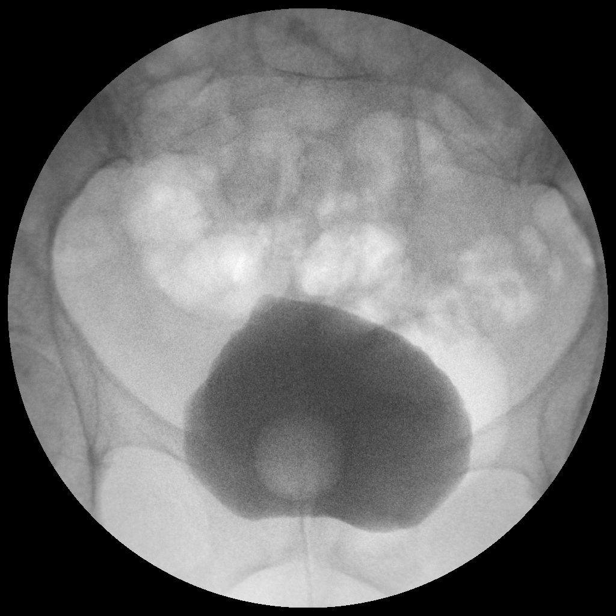

[Series 8: run · 1 of 1 slices shown (8 of 14)]
[im 1/1]
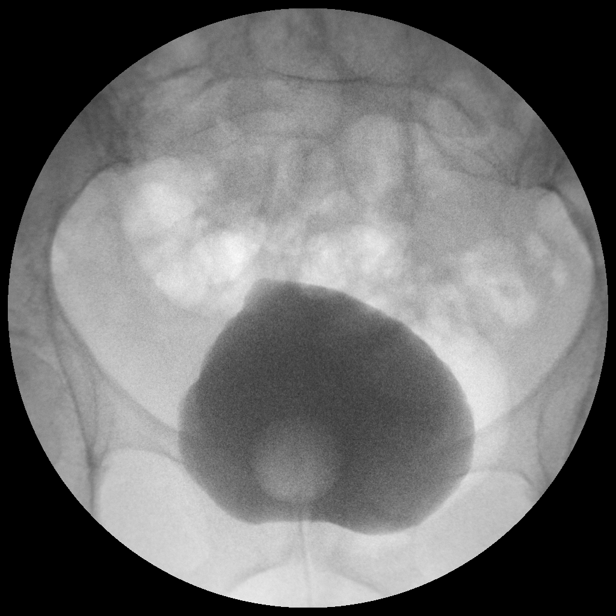

[Series 9: run · 1 of 1 slices shown (9 of 14)]
[im 1/1]
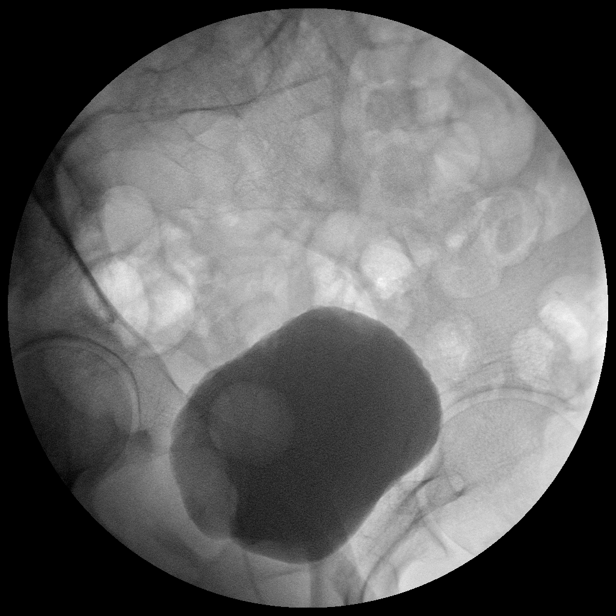

[Series 10: run · 1 of 1 slices shown (10 of 14)]
[im 1/1]
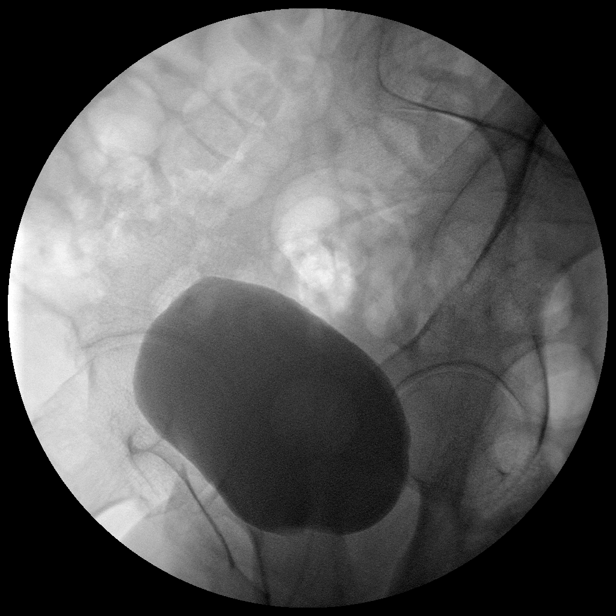

[Series 11: run · 1 of 1 slices shown (11 of 14)]
[im 1/1]
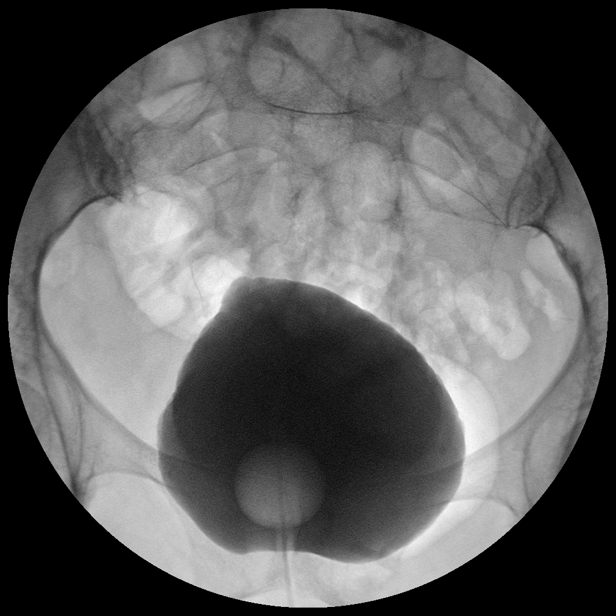

[Series 12: run · 1 of 1 slices shown (12 of 14)]
[im 1/1]
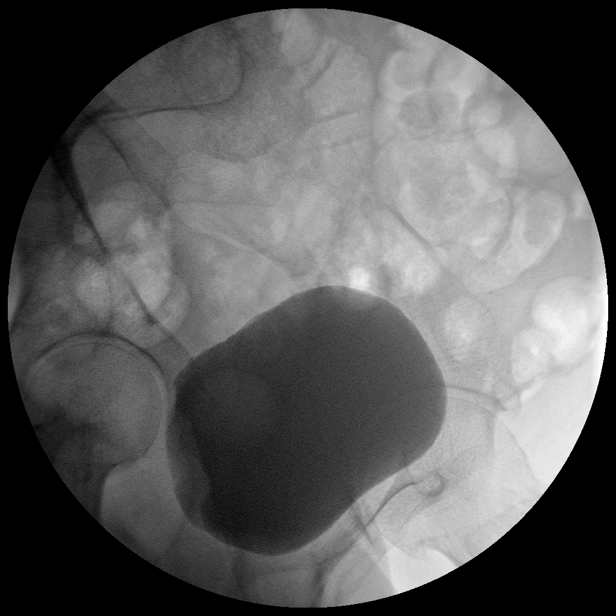

[Series 13: run · 1 of 1 slices shown (13 of 14)]
[im 1/1]
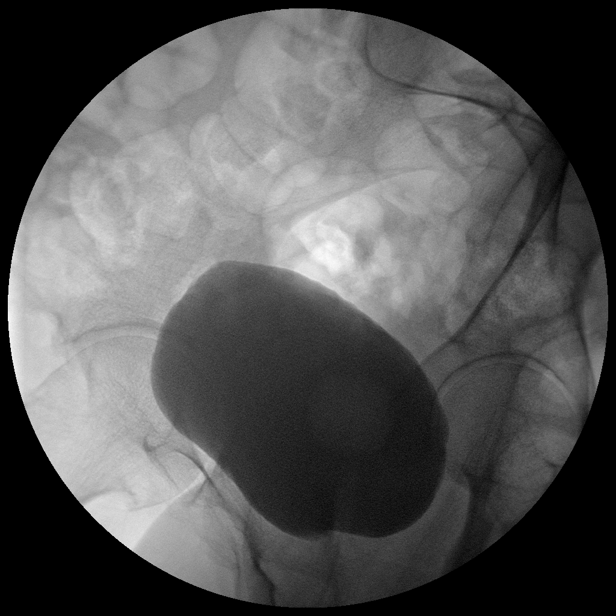

[Series 14: run · 1 of 1 slices shown (14 of 14)]
[im 1/1]
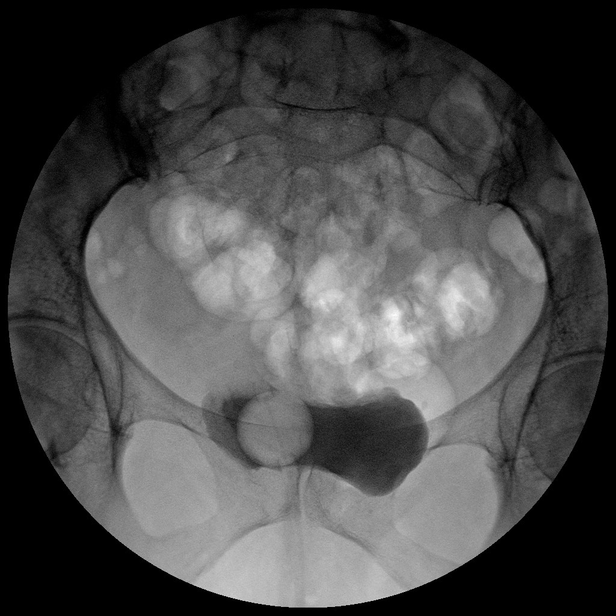

[14 of 14 positions shown; findings below may reference images not displayed]

FINDINGS: Bladder appears normal. No evidence of bladder leak for other wall
abnormality.
IMPRESSION: No evidence of leak.

## 2020-05-15 DIAGNOSIS — C801 Malignant (primary) neoplasm, unspecified: Secondary | ICD-10-CM

## 2020-05-15 HISTORY — DX: Malignant (primary) neoplasm, unspecified: C80.1

## 2020-05-20 ENCOUNTER — Other Ambulatory Visit: Payer: Self-pay

## 2020-05-25 ENCOUNTER — Ambulatory Visit: Payer: Self-pay | Admitting: Surgery

## 2020-05-25 DIAGNOSIS — C50912 Malignant neoplasm of unspecified site of left female breast: Secondary | ICD-10-CM

## 2020-05-25 NOTE — H&P (Signed)
Shannon Griffith Appointment: 05/25/2020 3:00 PM Location: [facility] Patient #: [id number] DOB: [birth year] Married / Language: [name] / Race: White Female  History of Present Illness Shannon Moores A. Malic Rosten MD; 05/25/2020 3:39 PM) Patient words: Patient presents for consultation to evaluate left breast abnormal mammogram. She was sent at the request of Dr. Luan Griffith. 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 Receptors are currently pending. 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 Her daughter had breast cancer at age 5 and was recently treated 2 years ago. She denies any history of breast pain, nipple discharge or breast mass. She gets yearly mammograms.  The patient is a 64 year old female.   Past Surgical History Shannon Griffith, CMA; 05/25/2020 3:06 PM) Breast Biopsy Bilateral. Gallbladder Surgery - Open Hysterectomy (not due to cancer) - Complete Tonsillectomy  Diagnostic Studies History Shannon Griffith, CMA; 05/25/2020 3:06 PM) Colonoscopy 1-5 years ago Mammogram within last year Pap Smear 1-5 years ago  Allergies Shannon Griffith, CMA; 05/25/2020 3:06 PM) No Known Drug Allergies [05/25/2020]:  Medication History Shannon Griffith, CMA; 05/25/2020 3:07 PM) Rosuvastatin Calcium (10MG  Tablet, Oral) Active. Wellbutrin XL (300MG  Tablet ER 24HR, Oral) Active. SEROquel XR (150MG  Tablet ER 24HR, Oral) Active. Medications Reconciled  Social History Shannon Griffith, CMA; 05/25/2020 3:06 PM) Alcohol use Occasional alcohol use. Caffeine use Coffee. No drug use Tobacco use Never smoker.  Family History Shannon Griffith, CMA; 05/25/2020 3:06 PM) Breast Cancer Daughter. Cancer Brother. Depression Mother. Melanoma Brother. Migraine Headache Mother. Respiratory Condition Father.  Pregnancy / Birth History  Shannon Griffith, CMA; 05/25/2020 3:06 PM) Age at menarche 49 years. Age of menopause 33-55 Gravida 4 Irregular periods Length (months) of breastfeeding 7-12 Maternal age 51-20 Para 4  Other Problems Shannon Griffith, CMA; 05/25/2020 3:06 PM) Bladder Problems Depression     Review of Systems (Shannon Baumgardner A. Lokelani Lutes MD; 05/25/2020 3:39 PM) General Not Present- Appetite Loss, Chills, Fatigue, Fever, Night Sweats, Weight Gain and Weight Loss. Skin Not Present- Change in Wart/Mole, Dryness, Hives, Jaundice, New Lesions, Non-Healing Wounds, Rash and Ulcer. HEENT Not Present- Earache, Hearing Loss, Hoarseness, Nose Bleed, Oral Ulcers, Ringing in the Ears, Seasonal Allergies, Sinus Pain, Sore Throat, Visual Disturbances, Wears glasses/contact lenses and Yellow Eyes. Respiratory Not Present- Bloody sputum, Chronic Cough, Difficulty Breathing, Snoring and Wheezing. Breast Not Present- Breast Mass, Breast Pain, Nipple Discharge and Skin Changes. Cardiovascular Not Present- Chest Pain, Difficulty Breathing Lying Down, Leg Cramps, Palpitations, Rapid Heart Rate, Shortness of Breath and Swelling of Extremities. Gastrointestinal Not Present- Abdominal Pain, Bloating, Bloody Stool, Change in Bowel Habits, Chronic diarrhea, Constipation, Difficulty Swallowing, Excessive gas, Gets full quickly at meals, Hemorrhoids, Indigestion, Nausea, Rectal Pain and Vomiting. Female Genitourinary Not Present- Frequency, Nocturia, Painful Urination, Pelvic Pain and Urgency. Musculoskeletal Not Present- Back Pain, Joint Pain, Joint Stiffness, Muscle Pain, Muscle Weakness and Swelling of Extremities. Neurological Not Present- Decreased Memory, Fainting, Headaches, Numbness, Seizures, Tingling, Tremor, Trouble walking and Weakness. Psychiatric Present- Depression. Not Present- Anxiety, Bipolar, Change in Sleep Pattern, Fearful and Frequent crying. Endocrine Not Present- Cold Intolerance, Excessive Hunger, Hair  Changes, Heat Intolerance, Hot flashes and New Diabetes. Hematology Not Present- Blood Thinners, Easy Bruising, Excessive bleeding, Gland problems, HIV and Persistent Infections. All other systems negative  Vitals [employer] R. Griffith CMA; 05/25/2020 3:06 PM) 05/25/2020 3:06 PM Weight: 165.38 lb Height: 66in Body Surface Area:  1.84 m Body Mass Index: 26.69 kg/m  Pulse: 115 (Regular)  BP: 138/84(Sitting, Left Arm, Standard)        Physical Exam (Shannon Struve A. Marguita Venning MD; 05/25/2020 3:39 PM)  General Mental Status-Alert. General Appearance-Consistent with stated age. Hydration-Well hydrated. Voice-Normal.  Head and Neck Head-normocephalic, atraumatic with no lesions or palpable masses. Trachea-midline. Thyroid Gland Characteristics - normal size and consistency.  Eye Eyeball - Bilateral-Extraocular movements intact. Sclera/Conjunctiva - Bilateral-No scleral icterus.  Chest and Lung Exam Chest and lung exam reveals -quiet, even and easy respiratory effort with no use of accessory muscles and on auscultation, normal breath sounds, no adventitious sounds and normal vocal resonance. Inspection Chest Wall - Normal. Back - normal.  Breast Breast - Left-Symmetric, Non Tender, No Biopsy scars, no Dimpling - Left, No Inflammation, No Lumpectomy scars, No Mastectomy scars, No Peau d' Orange. Breast - Right-Symmetric, Non Tender, No Biopsy scars, no Dimpling - Right, No Inflammation, No Lumpectomy scars, No Mastectomy scars, No Peau d' Orange. Breast Lump-No Palpable Breast Mass.  Cardiovascular Cardiovascular examination reveals -normal heart sounds, regular rate and rhythm with no murmurs and normal pedal pulses bilaterally.  Abdomen Inspection Inspection of the abdomen reveals - No Hernias. Skin - Scar - no surgical scars. Palpation/Percussion Palpation and Percussion of the abdomen reveal - Soft, Non Tender, No Rebound tenderness, No Rigidity  (guarding) and No hepatosplenomegaly. Auscultation Auscultation of the abdomen reveals - Bowel sounds normal.  Neurologic Neurologic evaluation reveals -alert and oriented x 3 with no impairment of recent or remote memory. Mental Status-Normal.  Musculoskeletal Normal Exam - Left-Upper Extremity Strength Normal and Lower Extremity Strength Normal. Normal Exam - Right-Upper Extremity Strength Normal and Lower Extremity Strength Normal.  Lymphatic Head & Neck  General Head & Neck Lymphatics: Bilateral - Description - Normal. Axillary  General Axillary Region: Bilateral - Description - Normal. Tenderness - Non Tender. Femoral & Inguinal  Generalized Femoral & Inguinal Lymphatics: Bilateral - Description - Normal. Tenderness - Non Tender.    Assessment & Plan (Acquanetta Cabanilla A. Mindy Behnken MD; 05/25/2020 3:41 PM)  BREAST CANCER, STAGE 1, LEFT (C50.912) Impression: Discussed breast conserving surgery with sentinel lymph node mapping versus mastectomy with reconstruction. Refer to medical and radiation oncology. May consider genetics given all of her with breast cancer. Discussed breast conserving surgery and she is opted to do so. Discussed lumpectomy received localization, sentinel lymph node mapping and the risks and benefits of this technique versus mastectomy with reconstruction. Discussed risk of lymphedema, cosmesis, sterile recovery time, and complications of the procedure. Risk of lumpectomy include bleeding, infection, seroma, more surgery, use of seed/wire, wound care, cosmetic deformity and the need for other treatments, death , blood clots, death. Pt agrees to proceed. Risk of sentinel lymph node mapping include bleeding, infection, lymphedema, shoulder pain. stiffness, dye allergy. cosmetic deformity , blood clots, death, need for more surgery. Pt agrees to proceed.   Total time 45 minutes   MALIGNANT NEOPLASM OF LEFT BREAST, STAGE 1, UNSPECIFIED ESTROGEN RECEPTOR STATUS  (C50.912)  Current Plans You are being scheduled for surgery- Our schedulers will call you.  You should hear from our office's scheduling department within 5 working days about the location, date, and time of surgery. We try to make accommodations for patient's preferences in scheduling surgery, but sometimes the OR schedule or the surgeon's schedule prevents [place] from making those accommodations.  If you have not heard from our office [phone]) in 5 working days, call the office and ask for your surgeon's nurse.  If you have other questions about your diagnosis, plan, or surgery, call the office and ask for your surgeon's nurse.  Pt Education - [facility] Breast Cancer Information Given - Alight "Breast Journey" Package We discussed the staging and pathophysiology of breast cancer. We discussed all of the different options for treatment for breast cancer including surgery, chemotherapy, radiation therapy, Herceptin, and antiestrogen therapy. We discussed a sentinel lymph node biopsy as she does not appear to having lymph node involvement right now. We discussed the performance of that with injection of radioactive tracer and blue dye. We discussed that she would have an incision underneath her axillary hairline. We discussed that there is a bout a 10-20% chance of having a positive node with a sentinel lymph node biopsy and we will await the permanent pathology to make any other first further decisions in terms of her treatment. One of these options might be to return to the operating room to perform an axillary lymph node dissection. We discussed about a 1-2% risk lifetime of chronic shoulder pain as well as lymphedema associated with a sentinel lymph node biopsy. We discussed the options for treatment of the breast cancer which included lumpectomy versus a mastectomy. We discussed the performance of the lumpectomy with a wire placement. We discussed a 10-20% chance of a positive margin requiring  reexcision in the operating room. We also discussed that she may need radiation therapy or antiestrogen therapy or both if she undergoes lumpectomy. We discussed the mastectomy and the postoperative care for that as well. We discussed that there is no difference in her survival whether she undergoes lumpectomy with radiation therapy or antiestrogen therapy versus a mastectomy. There is a slight difference in the local recurrence rate being 3-5% with lumpectomy and about 1% with a mastectomy. We discussed the risks of operation including bleeding, infection, possible reoperation. She understands her further therapy will be based on what her stages at the time of her operation.  Pt Education - breast cancer surgery: discussed with patient and provided information. Pt Education - [facility] Breast Biopsy HCI: discussed with patient and provided information. Pt Education - ABC (After Breast Cancer) Class Info: discussed with patient and provided information.

## 2020-05-25 NOTE — H&P (View-Only) (Signed)
Shannon Griffith Appointment: 05/25/2020 3:00 PM Location: [facility] Patient #: [id number] DOB: [birth year] Married / Language: Shannon Griffith / Race: White Female  History of Present Illness [name] A. Hoby Kawai MD; 05/25/2020 3:39 PM) Patient words: Patient presents for consultation to evaluate left breast abnormal mammogram. She was sent at the request of Dr. Luan Pulling. 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 Receptors are currently pending. 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 Her daughter had breast cancer at age 78 and was recently treated 2 years ago. She denies any history of breast pain, nipple discharge or breast mass. She gets yearly mammograms.  The patient is a 64 year old female.   Past Surgical History [name], CMA; 05/25/2020 3:06 PM) Breast Biopsy Bilateral. Gallbladder Surgery - Open Hysterectomy (not due to cancer) - Complete Tonsillectomy  Diagnostic Studies History Sharyn Lull R. [name], CMA; 05/25/2020 3:06 PM) Colonoscopy 1-5 years ago Mammogram within last year Pap Smear 1-5 years ago  Allergies [name], CMA; 05/25/2020 3:06 PM) No Known Drug Allergies [05/25/2020]:  Medication History [name], CMA; 05/25/2020 3:07 PM) Rosuvastatin Calcium (10MG  Tablet, Oral) Active. Wellbutrin XL (300MG  Tablet ER 24HR, Oral) Active. SEROquel XR (150MG  Tablet ER 24HR, Oral) Active. Medications Reconciled  Social History [name], CMA; 05/25/2020 3:06 PM) Alcohol use Occasional alcohol use. Caffeine use Coffee. No drug use Tobacco use Never smoker.  Family History Sharyn Lull R. [name], CMA; 05/25/2020 3:06 PM) Breast Cancer Daughter. Cancer Brother. Depression Mother. Melanoma Brother. Migraine Headache Mother. Respiratory Condition Father.  Pregnancy / Birth History  Sharyn Lull R. [name], CMA; 05/25/2020 3:06 PM) Age at menarche 59 years. Age of menopause 57-55 Gravida 4 Irregular periods Length (months) of breastfeeding 7-12 Maternal age 101-20 Para 4  Other Problems Sharyn Lull R. [name], CMA; 05/25/2020 3:06 PM) Bladder Problems Depression     Review of Systems (Cord Wilczynski A. Nickalos Petersen MD; 05/25/2020 3:39 PM) General Not Present- Appetite Loss, Chills, Fatigue, Fever, Night Sweats, Weight Gain and Weight Loss. Skin Not Present- Change in Wart/Mole, Dryness, Hives, Jaundice, New Lesions, Non-Healing Wounds, Rash and Ulcer. HEENT Not Present- Earache, Hearing Loss, Hoarseness, Nose Bleed, Oral Ulcers, Ringing in the Ears, Seasonal Allergies, Sinus Pain, Sore Throat, Visual Disturbances, Wears glasses/contact lenses and Yellow Eyes. Respiratory Not Present- Bloody sputum, Chronic Cough, Difficulty Breathing, Snoring and Wheezing. Breast Not Present- Breast Mass, Breast Pain, Nipple Discharge and Skin Changes. Cardiovascular Not Present- Chest Pain, Difficulty Breathing Lying Down, Leg Cramps, Palpitations, Rapid Heart Rate, Shortness of Breath and Swelling of Extremities. Gastrointestinal Not Present- Abdominal Pain, Bloating, Bloody Stool, Change in Bowel Habits, Chronic diarrhea, Constipation, Difficulty Swallowing, Excessive gas, Gets full quickly at meals, Hemorrhoids, Indigestion, Nausea, Rectal Pain and Vomiting. Female Genitourinary Not Present- Frequency, Nocturia, Painful Urination, Pelvic Pain and Urgency. Musculoskeletal Not Present- Back Pain, Joint Pain, Joint Stiffness, Muscle Pain, Muscle Weakness and Swelling of Extremities. Neurological Not Present- Decreased Memory, Fainting, Headaches, Numbness, Seizures, Tingling, Tremor, Trouble walking and Weakness. Psychiatric Present- Depression. Not Present- Anxiety, Bipolar, Change in Sleep Pattern, Fearful and Frequent crying. Endocrine Not Present- Cold Intolerance, Excessive Hunger, Hair  Changes, Heat Intolerance, Hot flashes and New Diabetes. Hematology Not Present- Blood Thinners, Easy Bruising, Excessive bleeding, Gland problems, HIV and Persistent Infections. All other systems negative  [employer] R. Brooks CMA; 05/25/2020 3:06 PM) 05/25/2020 3:06 PM Weight: 165.38 lb Height: 66in Body Surface Area:  1.84 m Body Mass Index: 26.69 kg/m  Pulse: 115 (Regular)  BP: 138/84(Sitting, Left Arm, Standard)        Physical Exam ([name] MD; 05/25/2020 3:39 PM)  General Mental Status-Alert. General Appearance-Consistent with stated age. Hydration-Well hydrated. Voice-Normal.  Head and Neck Head-normocephalic, atraumatic with no lesions or palpable masses. Trachea-midline. Thyroid Gland Characteristics - normal size and consistency.  Eye Eyeball - Bilateral-Extraocular movements intact. Sclera/Conjunctiva - Bilateral-No scleral icterus.  Chest and Lung Exam Chest and lung exam reveals -quiet, even and easy respiratory effort with no use of accessory muscles and on auscultation, normal breath sounds, no adventitious sounds and normal vocal resonance. Inspection Chest Wall - Normal. Back - normal.  Breast Breast - Left-Symmetric, Non Tender, No Biopsy scars, no Dimpling - Left, No Inflammation, No Lumpectomy scars, No Mastectomy scars, No Peau d' Orange. Breast - Right-Symmetric, Non Tender, No Biopsy scars, no Dimpling - Right, No Inflammation, No Lumpectomy scars, No Mastectomy scars, No Peau d' Orange. Breast Lump-No Palpable Breast Mass.  Cardiovascular Cardiovascular examination reveals -normal heart sounds, regular rate and rhythm with no murmurs and normal pedal pulses bilaterally.  Abdomen Inspection Inspection of the abdomen reveals - No Hernias. Skin - Scar - no surgical scars. Palpation/Percussion Palpation and Percussion of the abdomen reveal - Soft, Non Tender, No Rebound tenderness, No Rigidity  (guarding) and No hepatosplenomegaly. Auscultation Auscultation of the abdomen reveals - Bowel sounds normal.  Neurologic Neurologic evaluation reveals -alert and oriented x 3 with no impairment of recent or remote memory. Mental Status-Normal.  Musculoskeletal Normal Exam - Left-Upper Extremity Strength Normal and Lower Extremity Strength Normal. Normal Exam - Right-Upper Extremity Strength Normal and Lower Extremity Strength Normal.  Lymphatic Head & Neck  General Head & Neck Lymphatics: Bilateral - Description - Normal. Axillary  General Axillary Region: Bilateral - Description - Normal. Tenderness - Non Tender. Femoral & Inguinal  Generalized Femoral & Inguinal Lymphatics: Bilateral - Description - Normal. Tenderness - Non Tender.    Assessment & Plan (Silvia Markuson A. Rheannon Cerney MD; 05/25/2020 3:41 PM)  BREAST CANCER, STAGE 1, LEFT (C50.912) Impression: Discussed breast conserving surgery with sentinel lymph node mapping versus mastectomy with reconstruction. Refer to medical and radiation oncology. May consider genetics given all of her with breast cancer. Discussed breast conserving surgery and she is opted to do so. Discussed lumpectomy received localization, sentinel lymph node mapping and the risks and benefits of this technique versus mastectomy with reconstruction. Discussed risk of lymphedema, cosmesis, sterile recovery time, and complications of the procedure. Risk of lumpectomy include bleeding, infection, seroma, more surgery, use of seed/wire, wound care, cosmetic deformity and the need for other treatments, death , blood clots, death. Pt agrees to proceed. Risk of sentinel lymph node mapping include bleeding, infection, lymphedema, shoulder pain. stiffness, dye allergy. cosmetic deformity , blood clots, death, need for more surgery. Pt agrees to proceed.   Total time 45 minutes   MALIGNANT NEOPLASM OF LEFT BREAST, STAGE 1, UNSPECIFIED ESTROGEN RECEPTOR STATUS  (C50.912)  Current Plans You are being scheduled for surgery- Our schedulers will call you.  You should hear from our office's scheduling department within 5 working days about the location, date, and time of surgery. We try to make accommodations for patient's preferences in scheduling surgery, but sometimes the OR schedule or the surgeon's schedule prevents [place] from making those accommodations.  If you have not heard from our office [phone]) in 5 working days, call the office and ask for your surgeon's nurse.  If you have other questions about your diagnosis, plan, or surgery, call the office and ask for your surgeon's nurse.  Pt Education - [facility] Breast Cancer Information Given - Alight "Breast Journey" Package We discussed the staging and pathophysiology of breast cancer. We discussed all of the different options for treatment for breast cancer including surgery, chemotherapy, radiation therapy, Herceptin, and antiestrogen therapy. We discussed a sentinel lymph node biopsy as she does not appear to having lymph node involvement right now. We discussed the performance of that with injection of radioactive tracer and blue dye. We discussed that she would have an incision underneath her axillary hairline. We discussed that there is a bout a 10-20% chance of having a positive node with a sentinel lymph node biopsy and we will await the permanent pathology to make any other first further decisions in terms of her treatment. One of these options might be to return to the operating room to perform an axillary lymph node dissection. We discussed about a 1-2% risk lifetime of chronic shoulder pain as well as lymphedema associated with a sentinel lymph node biopsy. We discussed the options for treatment of the breast cancer which included lumpectomy versus a mastectomy. We discussed the performance of the lumpectomy with a wire placement. We discussed a 10-20% chance of a positive margin requiring  reexcision in the operating room. We also discussed that she may need radiation therapy or antiestrogen therapy or both if she undergoes lumpectomy. We discussed the mastectomy and the postoperative care for that as well. We discussed that there is no difference in her survival whether she undergoes lumpectomy with radiation therapy or antiestrogen therapy versus a mastectomy. There is a slight difference in the local recurrence rate being 3-5% with lumpectomy and about 1% with a mastectomy. We discussed the risks of operation including bleeding, infection, possible reoperation. She understands her further therapy will be based on what her stages at the time of her operation.  Pt Education - breast cancer surgery: discussed with patient and provided information. Pt Education - [facility] Breast Biopsy HCI: discussed with patient and provided information. Pt Education - ABC (After Breast Cancer) Class Info: discussed with patient and provided information.

## 2020-05-31 NOTE — Progress Notes (Signed)
[facility] CONSULT NOTE  Patient Care Team: Chesley Noon, MD as PCP - General (Family Medicine)  CHIEF COMPLAINTS/PURPOSE OF CONSULTATION:  Newly diagnosed breast cancer  HISTORY OF PRESENTING ILLNESS:  Shannon Griffith 64 y.o. female is here because of recent diagnosis of invasive ductal carcinoma of the left breast. Mammogram showed a 0.7cm left upper quadrant breast mass. 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 She presents to the clinic today for initial evaluation and discussion of treatment options.   I reviewed her records extensively and collaborated the history with the patient.  SUMMARY OF ONCOLOGIC HISTORY: Oncology History  Malignant neoplasm of upper-outer quadrant of left breast in female, estrogen receptor positive ([place])  06/01/2020 Initial Diagnosis   Mammogram showed a 0.7cm left upper quadrant breast mass. 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   06/01/2020 Cancer Staging   Staging form: Breast, AJCC 8th Edition - Clinical stage from 06/01/2020: Stage IB (cT1b, cN0, cM0, G3, ER+, PR-, HER2-) - Signed by Nicholas Lose, MD on 06/01/2020     MEDICAL HISTORY:  Past Medical History:  Diagnosis Date  . Anxiety   . Arthritis    fingers  . Depression     SURGICAL HISTORY: Past Surgical History:  Procedure Laterality Date  . ANTERIOR AND POSTERIOR REPAIR WITH SACROSPINOUS FIXATION N/A 07/12/2016   Procedure: ANTERIOR AND POSTERIOR REPAIR WITH SACROSPINOUS FIXATION;  Surgeon: Molli Posey, MD;  Location: [facility];  Service: Gynecology;  Laterality: N/A;  . CHOLECYSTECTOMY    . COLONOSCOPY    . CYSTOSCOPY N/A 07/12/2016   Procedure:  CYSTOSCOPY;  Surgeon: Molli Posey, MD;  Location: [facility];  Service: Gynecology;  Laterality: N/A;  . DILATION AND CURETTAGE OF UTERUS     twice  . LAPAROSCOPIC VAGINAL HYSTERECTOMY WITH SALPINGO OOPHORECTOMY Bilateral 07/12/2016   Procedure: LAPAROSCOPIC ASSISTED VAGINAL HYSTERECTOMY WITH SALPINGO OOPHORECTOMY;  Surgeon: Molli Posey, MD;  Location: [facility];  Service: Gynecology;  Laterality: Bilateral;    SOCIAL HISTORY: Social History   Socioeconomic History  . Marital status: Married    Spouse name: Not on file  . Number of children: Not on file  . Years of education: Not on file  . Highest education level: Not on file  Occupational History  . Not on file  Tobacco Use  . Smoking status: Never Smoker  . Smokeless tobacco: Never Used  Substance and Sexual Activity  . Alcohol use: Yes    Comment: occ  . Drug use: No  . Sexual activity: Not on file  Other Topics Concern  . Not on file  Social History Narrative  . Not on file   Social Determinants of Health   Financial Resource Strain:   . Difficulty of Paying Living Expenses: Not on file  Food Insecurity:   . Worried About Charity fundraiser in the Last Year: Not on file  . Ran Out of Food in the Last Year: Not on file  Transportation Needs:   . Lack of Transportation (Medical): Not on file  . Lack of Transportation (Non-Medical): Not on file  Physical Activity:   . Days of Exercise per Week: Not on file  . Minutes of  Exercise per Session: Not on file  Stress:   . Feeling of Stress : Not on file  Social Connections:   . Frequency of Communication with Friends and Family: Not on file  . Frequency of Social Gatherings with Friends and Family: Not on file  . Attends Religious Services: Not on file  . Active Member of Clubs or Organizations: Not on file  . Attends Archivist Meetings: Not on file  . Marital Status: Not on file  Intimate Partner Violence:   . Fear of Current or Ex-Partner: Not on file    . Emotionally Abused: Not on file  . Physically Abused: Not on file  . Sexually Abused: Not on file    FAMILY HISTORY: No family history on file.  ALLERGIES:  has No Known Allergies.  MEDICATIONS:  Current Outpatient Medications  Medication Sig Dispense Refill  . buPROPion (WELLBUTRIN XL) 300 MG 24 hr tablet Take 300 mg by mouth at bedtime.  3  . ibuprofen (ADVIL,MOTRIN) 800 MG tablet Take 1 tablet (800 mg total) by mouth every 8 (eight) hours as needed for moderate pain (mild pain). 30 tablet 1  . oxyCODONE-acetaminophen (PERCOCET/ROXICET) 5-325 MG tablet Take 1-2 tablets by mouth every 4 (four) hours as needed for severe pain (moderate to severe pain (when tolerating fluids)). 30 tablet 0  . QUEtiapine Fumarate (SEROQUEL XR) 150 MG 24 hr tablet Take 150 mg by mouth at bedtime.  3   No current facility-administered medications for this visit.      PHYSICAL EXAMINATION: ECOG PERFORMANCE STATUS: 1 - Symptomatic but completely ambulatory  Vitals:   06/01/20 1304  BP: (!) 150/94  Pulse: 88  Resp: 17  Temp: 98 F (36.7 C)  SpO2: 97%   Filed Weights   06/01/20 1304  Weight: 165 lb 12.8 oz (75.2 kg)       LABORATORY DATA:  I have reviewed the data as listed Lab Results  Component Value Date   WBC 9.5 07/13/2016   HGB 10.4 (L) 07/13/2016   HCT 31.9 (L) 07/13/2016   MCV 88.9 07/13/2016   PLT 207 07/13/2016   No results found for: NA, K, CL, CO2  RADIOGRAPHIC STUDIES: I have personally reviewed the radiological reports and agreed with the findings in the report.  ASSESSMENT AND PLAN:  Malignant neoplasm of upper-outer quadrant of left breast in female, estrogen receptor positive ([place]) Mammogram showed a 0.7cm left upper quadrant breast mass. 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 After reviewing the pathology in detail, we proceeded to discuss the different treatment options between surgery, radiation, chemotherapy, antiestrogen therapies.  Recommendations: 1. Breast conserving surgery followed by 2. Oncotype DX testing to determine if chemotherapy would be of any benefit followed by 3. Adjuvant radiation therapy followed by 4. Adjuvant antiestrogen therapy  Oncotype counseling: I discussed Oncotype DX test. I explained to the patient that this is a 21 gene panel to evaluate patient tumors DNA to calculate recurrence score. This would help determine whether patient has high risk or intermediate risk or low risk breast cancer. She understands that if her tumor was found to be high risk,  she would benefit from systemic chemotherapy. If low risk, no need of chemotherapy. If she was found to be intermediate risk, we would need to evaluate the score as well as other risk factors and determine if an abbreviated chemotherapy may be of benefit.  Return to clinic after surgery to discuss final pathology report and then determine if Oncotype DX testing will need to be sent.   All questions were answered. The patient knows to call the clinic with any problems, questions or concerns.   Rulon Eisenmenger, MD, MPH 06/01/2020    I, Molly Dorshimer, am acting as scribe for Nicholas Lose, MD.  I have reviewed the above documentation for accuracy and completeness, and I agree with the above.

## 2020-06-01 ENCOUNTER — Telehealth: Payer: Self-pay | Admitting: Hematology and Oncology

## 2020-06-01 ENCOUNTER — Telehealth: Payer: Self-pay | Admitting: *Deleted

## 2020-06-01 ENCOUNTER — Other Ambulatory Visit: Payer: Self-pay

## 2020-06-01 ENCOUNTER — Inpatient Hospital Stay: Payer: Medicare HMO | Attending: Hematology and Oncology | Admitting: Hematology and Oncology

## 2020-06-01 ENCOUNTER — Encounter: Payer: Self-pay | Admitting: *Deleted

## 2020-06-01 DIAGNOSIS — Z170 Estrogen receptor positive status [ER+]: Secondary | ICD-10-CM | POA: Diagnosis not present

## 2020-06-01 DIAGNOSIS — C50412 Malignant neoplasm of upper-outer quadrant of left female breast: Secondary | ICD-10-CM

## 2020-06-01 DIAGNOSIS — N62 Hypertrophy of breast: Secondary | ICD-10-CM

## 2020-06-01 NOTE — Telephone Encounter (Signed)
Left vm providing pt with navigation resources and contact information. Request return call with questions or needs.

## 2020-06-01 NOTE — Telephone Encounter (Signed)
Called pt per 10/18 sch msg - no answer. Left message for patient with appt date and time.

## 2020-06-01 NOTE — Assessment & Plan Note (Signed)
Mammogram showed a 0.7cm left upper quadrant breast mass. 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 After reviewing the pathology in detail, we proceeded to discuss the different treatment options between surgery, radiation, chemotherapy, antiestrogen therapies.  Recommendations: 1. Breast conserving surgery followed by 2. Oncotype DX testing to determine if chemotherapy would be of any benefit followed by 3. Adjuvant radiation therapy followed by 4. Adjuvant antiestrogen therapy  Oncotype counseling: I discussed Oncotype DX test. I explained to the patient that this is a 21 gene panel to evaluate patient tumors DNA to calculate recurrence score. This would help determine whether patient has high risk or intermediate risk or low risk breast cancer. She understands that if her tumor was found to be high risk, she would benefit from systemic chemotherapy. If low risk, no need of chemotherapy. If she was found to be intermediate risk, we would need to evaluate the score as well as other risk factors and determine if an abbreviated chemotherapy may be of benefit.  Return to clinic after surgery to discuss final pathology report and then determine if Oncotype DX testing will need to be sent.

## 2020-06-02 NOTE — Progress Notes (Signed)
Location of Breast Cancer: Left UOQ  Did patient present with symptoms (if so, please note symptoms) or was this found on screening mammography?: Routine mammogram  Mammogram: 0.7cm left upper quadrant breast mass.  Histology per Pathology Report: Left Breast 05/20/2020  Receptor Status: ER(+ 90%), PR (+ 2%), Her2-neu (), Ki-67(15%)   Past/Anticipated interventions by surgeon, if any: Dr. Brantley Stage -Left Breast Lumpectomy with radioactive seed and SLN mapping 06/17/2020   Past/Anticipated interventions by medical oncology, if any: Chemotherapy  Dr. Lindi Adie 06/01/2020 Recommendations: 1. Breast conserving surgery followed by 2. Oncotype DX testing to determine if chemotherapy would be of any benefit followed by 3. Adjuvant radiation therapy followed by 4. Adjuvant antiestrogen therapy   Lymphedema issues, if any:  no  Pain issues, if any:  No  SAFETY ISSUES:  Prior radiation? No  Pacemaker/ICD? No  Possible current pregnancy? Hysterectomy  Is the patient on methotrexate? No  Current Complaints / other details:   -?? Oncotype testing     Cori Razor, RN 06/02/2020,1:43 PM

## 2020-06-03 ENCOUNTER — Other Ambulatory Visit: Payer: Self-pay

## 2020-06-03 ENCOUNTER — Ambulatory Visit
Admission: RE | Admit: 2020-06-03 | Discharge: 2020-06-03 | Disposition: A | Discharge status: Home / Self Care | Payer: Medicare HMO | Source: Ambulatory Visit | Attending: Radiation Oncology | Admitting: Radiation Oncology

## 2020-06-03 ENCOUNTER — Encounter: Payer: Self-pay | Admitting: Radiation Oncology

## 2020-06-03 VITALS — BP 151/78 | HR 87 | Temp 97.7°F | Resp 18 | Ht 66.0 in | Wt 165.4 lb

## 2020-06-03 DIAGNOSIS — Z85820 Personal history of malignant melanoma of skin: Secondary | ICD-10-CM | POA: Diagnosis not present

## 2020-06-03 DIAGNOSIS — F418 Other specified anxiety disorders: Secondary | ICD-10-CM | POA: Diagnosis not present

## 2020-06-03 DIAGNOSIS — Z79899 Other long term (current) drug therapy: Secondary | ICD-10-CM | POA: Diagnosis not present

## 2020-06-03 DIAGNOSIS — Z801 Family history of malignant neoplasm of trachea, bronchus and lung: Secondary | ICD-10-CM | POA: Diagnosis not present

## 2020-06-03 DIAGNOSIS — M129 Arthropathy, unspecified: Secondary | ICD-10-CM | POA: Diagnosis not present

## 2020-06-03 DIAGNOSIS — C50412 Malignant neoplasm of upper-outer quadrant of left female breast: Secondary | ICD-10-CM | POA: Insufficient documentation

## 2020-06-03 DIAGNOSIS — Z170 Estrogen receptor positive status [ER+]: Secondary | ICD-10-CM | POA: Diagnosis not present

## 2020-06-03 DIAGNOSIS — Z803 Family history of malignant neoplasm of breast: Secondary | ICD-10-CM | POA: Insufficient documentation

## 2020-06-03 NOTE — Progress Notes (Signed)
Radiation Oncology         [phone] ________________________________  Name: Shannon Griffith        MRN: [id number]  Date of Service: 06/03/2020 DOB: [birth year]  XN:[id number], Rebeca Alert, MD  Erroll Luna, MD     REFERRING PHYSICIAN: Erroll Luna, MD   DIAGNOSIS: The encounter diagnosis was Malignant neoplasm of upper-outer quadrant of left breast in female, estrogen receptor positive ([place]).   HISTORY OF PRESENT ILLNESS: Shannon Griffith is a 64 y.o. female seen  for a new diagnosis of left breast cancer. The patient was noted to have screening detected distortion in bilateral breasts.  Further diagnostic imaging revealed an 8 mm mass in the right breast within the upper outer quadrant at 9:30 position, her right axilla was negative for adenopathy.  Her left breast revealed a mass measuring 7 mm in the 1 o'clock position that was also negative for axillary lymphadenopathy.  Biopsies on 05/20/2020 revealed columnar hyperplasia within the right breast mass, negative for malignancy.  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  She is seen today to discuss treatment recommendations for her cancer but plans to undergo breast conserving surgery with lumpectomy on 06/17/2020 with Dr. Brantley Stage.  She has met with Dr. Lindi Adie as well who plans to offer Oncotype testing to determine a role for any adjuvant chemotherapy. She also meets with genetics next week.    PREVIOUS RADIATION THERAPY: No   PAST MEDICAL HISTORY:  Past Medical History:  Diagnosis Date  . Anxiety   . Arthritis    fingers  . Depression        PAST SURGICAL HISTORY: Past Surgical History:  Procedure Laterality Date  . ANTERIOR AND POSTERIOR REPAIR WITH SACROSPINOUS FIXATION N/A 07/12/2016   Procedure: ANTERIOR AND POSTERIOR REPAIR WITH SACROSPINOUS FIXATION;  Surgeon: Molli Posey, MD;  Location: [facility];  Service:  Gynecology;  Laterality: N/A;  . CHOLECYSTECTOMY    . COLONOSCOPY    . CYSTOSCOPY N/A 07/12/2016   Procedure: CYSTOSCOPY;  Surgeon: Molli Posey, MD;  Location: [facility];  Service: Gynecology;  Laterality: N/A;  . DILATION AND CURETTAGE OF UTERUS     twice  . LAPAROSCOPIC VAGINAL HYSTERECTOMY WITH SALPINGO OOPHORECTOMY Bilateral 07/12/2016   Procedure: LAPAROSCOPIC ASSISTED VAGINAL HYSTERECTOMY WITH SALPINGO OOPHORECTOMY;  Surgeon: Molli Posey, MD;  Location: [facility];  Service: Gynecology;  Laterality: Bilateral;     FAMILY HISTORY:  Family History  Problem Relation Age of Onset  . Melanoma Brother   . Breast cancer Daughter 21       Stage III lobular carcinoma  . Lung cancer Brother      SOCIAL HISTORY:  reports that she has never smoked. She has never used smokeless tobacco. She reports current alcohol use. She reports that she does not use drugs.  The patient is married and lives in [place]. She relocated to [facility] about 6 years ago from [place] to help care for her grandchildren. She also moved her 10 year old parents to [facility] in the last year as well to help care for them too.   ALLERGIES: Cephalexin, Nitrofurantoin monohyd macro, and Other   MEDICATIONS:  Current Outpatient Medications  Medication Sig Dispense Refill  . albuterol (VENTOLIN HFA) 108 (90 Base) MCG/ACT inhaler Inhale into the lungs.    Marland Kitchen buPROPion (WELLBUTRIN XL) 300 MG 24 hr tablet Take 300 mg by mouth at bedtime.  3  .  ibandronate (BONIVA) 150 MG tablet Take by mouth.    Marland Kitchen ibuprofen (ADVIL,MOTRIN) 800 MG tablet Take 1 tablet (800 mg total) by mouth every 8 (eight) hours as needed for moderate pain (mild pain). 30 tablet 1  . montelukast (SINGULAIR) 10 MG tablet Take by mouth.    . QUEtiapine Fumarate (SEROQUEL XR) 150 MG 24 hr tablet Take 150 mg by mouth at bedtime.  3  . rosuvastatin (CRESTOR) 10 MG tablet Take 10 mg by mouth at bedtime.     No current facility-administered medications for this  encounter.     REVIEW OF SYSTEMS: On review of systems, the patient reports that she is doing well overall but is worried about how to allocate time caring for her parents and grandchildren. She denies any concerns about her breasts at this time.    PHYSICAL EXAM:  Wt Readings from Last 3 Encounters:  06/03/20 165 lb 6.4 oz (75 kg)  06/01/20 165 lb 12.8 oz (75.2 kg)  07/13/16 153 lb (69.4 kg)   Temp Readings from Last 3 Encounters:  06/03/20 97.7 F (36.5 C)  06/01/20 98 F (36.7 C) (Tympanic)  07/13/16 98 F (36.7 C) (Oral)   BP Readings from Last 3 Encounters:  06/03/20 (!) 151/78  06/01/20 (!) 150/94  07/13/16 120/63   Pulse Readings from Last 3 Encounters:  06/03/20 87  06/01/20 88  07/13/16 70    In general this is a well appearing Caucasian female in no acute distress. She's alert and oriented x4 and appropriate throughout the examination. Cardiopulmonary assessment is negative for acute distress and she exhibits normal effort. Bilateral breast exam is deferred.    ECOG = 0  0 - Asymptomatic (Fully active, able to carry on all predisease activities without restriction)  1 - Symptomatic but completely ambulatory (Restricted in physically strenuous activity but ambulatory and able to carry out work of a light or sedentary nature. For example, light housework, office work)  2 - Symptomatic, <50% in bed during the day (Ambulatory and capable of all self care but unable to carry out any work activities. Up and about more than 50% of waking hours)  3 - Symptomatic, >50% in bed, but not bedbound (Capable of only limited self-care, confined to bed or chair 50% or more of waking hours)  4 - Bedbound (Completely disabled. Cannot carry on any self-care. Totally confined to bed or chair)  5 - Death   Eustace Pen MM, Creech RH, Tormey DC, et al. [phone]). "Toxicity and response criteria of the [facility]". [place] Oncol. 5 (6): 649-55    LABORATORY  DATA:  Lab Results  Component Value Date   WBC 9.5 07/13/2016   HGB 10.4 (L) 07/13/2016   HCT 31.9 (L) 07/13/2016   MCV 88.9 07/13/2016   PLT 207 07/13/2016   No results found for: NA, K, CL, CO2 No results found for: ALT, AST, GGT, ALKPHOS, BILITOT    RADIOGRAPHY: No results found.     IMPRESSION/PLAN: 1. Stage IA, cT1bN0M0 grade 3, ER/PR positive invasive ductal carcinoma of the left breast with columnar hyperplasia of the right breast. Dr. Lisbeth Renshaw discusses the pathology findings and reviews the nature of left breast disease. The consensus from the breast conference includes breast conservation with lumpectomies of each breast with sentinel node biopsy. Dr. Lindi Adie is considering Oncotype Dx score to determine a role for systemic therapy. Provided that chemotherapy is not indicated, the patient's course would then be followed by external radiotherapy to  the breast followed by antiestrogen therapy. We discussed the risks, benefits, short, and long term effects of radiotherapy, and the patient is interested in proceeding. Dr. Lisbeth Renshaw discusses the delivery and logistics of radiotherapy and anticipates a course of 4 or 6 1/2 weeks of radiotherapy to the left breast with deep inspiration breath hold technique. We will see her back a few weeks after surgery to discuss the simulation process and anticipate we starting radiotherapy about 4-6 weeks after surgery.    In a visit lasting 60 minutes, greater than 50% of the time was spent face to face reviewing her case, as well as in preparation of, discussing, and coordinating the patient's care.  The above documentation reflects my direct findings during this shared patient visit. Please see the separate note by Dr. Lisbeth Renshaw on this date for the remainder of the patient's plan of care.    Carola Rhine, PAC

## 2020-06-04 ENCOUNTER — Encounter: Payer: Self-pay | Admitting: Licensed Clinical Social Worker

## 2020-06-04 NOTE — Progress Notes (Signed)
[facility] Work  Initial Assessment   Shannon Griffith is a 64 y.o. year old female contacted by phone. Clinical Social Work was referred by new patient screening for assessment of psychosocial needs.   SDOH (Social Determinants of Health) assessments performed: Yes SDOH Interventions     Most Recent Value  SDOH Interventions  SDOH Interventions for the Following Domains Financial Strain, Food Insecurity, Housing, Transportation  Food Insecurity Interventions Intervention Not Indicated  Financial Strain Interventions Intervention Not Indicated  Housing Interventions Intervention Not Indicated  Transportation Interventions Intervention Not Indicated      Distress Screen completed: Yes:   ONCBCN DISTRESS SCREENING 06/03/2020  Screening Type Initial Screening  Distress experienced in past week (1-10) 4  Family Problem type Other (comment)  Emotional problem type Nervousness/Anxiety;Adjusting to illness  Other Contact via text or email.    Family/Social Information:  . Housing Arrangement: patient lives with husband, Rush Landmark. Married 40 years. Elderly parents live in apartment nearby and pt helps take care of them . Family members/support persons in your life? Family- husband, 3 daughters, 1 son. Daughter in [place] Sharyn Lull) has 3 young children. . Transportation concerns: no  . 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 Income source: husband's employment . Financial concerns: No o Type of concern: None . Food access concerns: no . Services Currently in place:  n/a  Coping/ Adjustment to diagnosis: . Patient understands treatment plan and what happens next? yes, knows her plan and that her cancer was caught early. Daughter Vida Roller) had Stage III lobular breast cancer dx 2 years ago. Now cancer free for 6 months. Patient was present for some of daughter's treatment . Concerns about diagnosis and/or treatment: How I will care for other members of my  family . Patient reported stressors: caring for parents . Hopes and priorities: Priority is to be treated to continue to be there for her family. She is getting used to accepting help and her daughter is flying in for 2 weeks to help after surgery . Patient enjoys time with family/ friends . Current coping skills/ strengths: Average or above average intelligence, Capable of independent living, Communication skills, Financial means and Supportive family/friends    SUMMARY: Current SDOH Barriers:  . Caregiving for elderly parents- is already finding resources for this  Clinical Social Work Clinical Goal(s):  Marland Kitchen Patient will continue to utilize her support systems as she goes through treatment  Interventions: . Discussed common feeling and emotions when being diagnosed with cancer, and the importance of support during treatment . Informed patient of the support team roles and support services at [facility] . Provided CSW contact information and encouraged patient to call with any questions or concerns   Follow Up Plan: Patient will contact this CSW if needs arise Patient verbalizes understanding of plan: Yes    Edwinna Areola Caeli Linehan , LCSW

## 2020-06-09 ENCOUNTER — Inpatient Hospital Stay: Payer: Medicare HMO

## 2020-06-09 ENCOUNTER — Other Ambulatory Visit: Payer: Self-pay | Admitting: Genetic Counselor

## 2020-06-09 ENCOUNTER — Telehealth: Payer: Self-pay | Admitting: Licensed Clinical Social Worker

## 2020-06-09 ENCOUNTER — Inpatient Hospital Stay (HOSPITAL_BASED_OUTPATIENT_CLINIC_OR_DEPARTMENT_OTHER): Payer: Medicare HMO | Admitting: Genetic Counselor

## 2020-06-09 ENCOUNTER — Other Ambulatory Visit: Payer: Self-pay

## 2020-06-09 DIAGNOSIS — C50412 Malignant neoplasm of upper-outer quadrant of left female breast: Secondary | ICD-10-CM

## 2020-06-09 DIAGNOSIS — Z170 Estrogen receptor positive status [ER+]: Secondary | ICD-10-CM

## 2020-06-09 DIAGNOSIS — Z808 Family history of malignant neoplasm of other organs or systems: Secondary | ICD-10-CM | POA: Diagnosis not present

## 2020-06-09 DIAGNOSIS — Z803 Family history of malignant neoplasm of breast: Secondary | ICD-10-CM | POA: Diagnosis not present

## 2020-06-09 DIAGNOSIS — Z801 Family history of malignant neoplasm of trachea, bronchus and lung: Secondary | ICD-10-CM

## 2020-06-09 DIAGNOSIS — Z800 Family history of malignant neoplasm of digestive organs: Secondary | ICD-10-CM

## 2020-06-09 NOTE — Telephone Encounter (Signed)
[facility] Work  Received notice from Dietitian that patient stressed about finding caregiving support for her parents while going through treatment. Her daughter will be coming to help for a few weeks.   CSW LVM for patient offering to help connect with any resources. [facility] may provide connection to a resource or looking into various agencies that provide "companion" or "personal care" services.   Edwinna Areola Aliscia Clayton, LCSW

## 2020-06-10 ENCOUNTER — Other Ambulatory Visit: Payer: Self-pay

## 2020-06-10 ENCOUNTER — Encounter (HOSPITAL_BASED_OUTPATIENT_CLINIC_OR_DEPARTMENT_OTHER): Payer: Self-pay | Admitting: Surgery

## 2020-06-10 LAB — GENETIC SCREENING ORDER

## 2020-06-11 ENCOUNTER — Encounter: Payer: Self-pay | Admitting: Genetic Counselor

## 2020-06-11 DIAGNOSIS — Z808 Family history of malignant neoplasm of other organs or systems: Secondary | ICD-10-CM | POA: Insufficient documentation

## 2020-06-11 DIAGNOSIS — Z800 Family history of malignant neoplasm of digestive organs: Secondary | ICD-10-CM | POA: Insufficient documentation

## 2020-06-11 DIAGNOSIS — Z801 Family history of malignant neoplasm of trachea, bronchus and lung: Secondary | ICD-10-CM | POA: Insufficient documentation

## 2020-06-11 DIAGNOSIS — Z803 Family history of malignant neoplasm of breast: Secondary | ICD-10-CM | POA: Insufficient documentation

## 2020-06-11 NOTE — Progress Notes (Signed)
REFERRING PROVIDER: Erroll Luna, MD [address] [place],  [place] [address]  PRIMARY PROVIDER:  Chesley Noon, MD  PRIMARY REASON FOR VISIT:  1. Malignant neoplasm of upper-outer quadrant of left breast in female, estrogen receptor positive ([place])   2. Family history of breast cancer   3. Family history of melanoma   4. Family history of lung cancer   5. Family history of throat cancer       HISTORY OF PRESENT ILLNESS:   Shannon Griffith, a 64 y.o. female, was seen for a [facility] cancer genetics consultation at the request of Dr. Brantley Stage due to a personal and family history of cancer.  Shannon Griffith presents to clinic today to discuss the possibility of a hereditary predisposition to cancer, genetic testing, and to further clarify her future cancer risks, as well as potential cancer risks for family members.   In October of 2021, at the age of 74, Shannon Griffith was diagnosed with invasive ductal carcinoma, ER+/PR+/Her2-, of the left breast. The treatment plan includes surgery (scheduled 06/17/20), oncotype, adjuvant radiation therapy, and adjuvant antiestrogen therapy.    CANCER HISTORY:  Oncology History  Malignant neoplasm of upper-outer quadrant of left breast in female, estrogen receptor positive ([place])  06/01/2020 Initial Diagnosis   Mammogram showed a 0.7cm left upper quadrant breast mass. 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     RISK FACTORS:  Menarche was at age 31.  First live birth at age 35.  OCP use for approximately 0 years.  Ovaries intact: no.  Hysterectomy: yes.  Menopausal status: postmenopausal.  HRT use: 0 years. Colonoscopy: yes; 7 years ago. Mammogram within the last year: yes. Number of breast biopsies: 3. Any excessive radiation exposure in the past: no   Past Medical History:  Diagnosis Date  .  Anxiety   . Arthritis    fingers  . Depression   . Family history of breast cancer   . Family history of lung cancer   . Family history of melanoma   . Family history of throat cancer     Past Surgical History:  Procedure Laterality Date  . ANTERIOR AND POSTERIOR REPAIR WITH SACROSPINOUS FIXATION N/A 07/12/2016   Procedure: ANTERIOR AND POSTERIOR REPAIR WITH SACROSPINOUS FIXATION;  Surgeon: Molli Posey, MD;  Location: [facility];  Service: Gynecology;  Laterality: N/A;  . CHOLECYSTECTOMY    . COLONOSCOPY    . CYSTOSCOPY N/A 07/12/2016   Procedure: CYSTOSCOPY;  Surgeon: Molli Posey, MD;  Location: [facility];  Service: Gynecology;  Laterality: N/A;  . DILATION AND CURETTAGE OF UTERUS     twice  . LAPAROSCOPIC VAGINAL HYSTERECTOMY WITH SALPINGO OOPHORECTOMY Bilateral 07/12/2016   Procedure: LAPAROSCOPIC ASSISTED VAGINAL HYSTERECTOMY WITH SALPINGO OOPHORECTOMY;  Surgeon: Molli Posey, MD;  Location: [facility];  Service: Gynecology;  Laterality: Bilateral;    Social History   Socioeconomic History  . Marital status: Married    Spouse name: Not on file  . Number of children: Not on file  . Years of education: Not on file  . Highest education level: Not on file  Occupational History  . Not on file  Tobacco Use  . Smoking status: Never Smoker  . Smokeless tobacco: Never Used  Substance and Sexual Activity  . Alcohol use: Yes    Comment: occ  . Drug use: No  .  Sexual activity: Not on file  Other Topics Concern  . Not on file  Social History Narrative  . Not on file   Social Determinants of Health   Financial Resource Strain:   . Difficulty of Paying Living Expenses: Not on file  Food Insecurity:   . Worried About Charity fundraiser in the Last Year: Not on file  . Ran Out of Food in the Last Year: Not on file  Transportation Needs:   . Lack of Transportation (Medical): Not on file  . Lack of Transportation (Non-Medical): Not on file  Physical Activity:   . Days of  Exercise per Week: Not on file  . Minutes of Exercise per Session: Not on file  Stress:   . Feeling of Stress : Not on file  Social Connections:   . Frequency of Communication with Friends and Family: Not on file  . Frequency of Social Gatherings with Friends and Family: Not on file  . Attends Religious Services: Not on file  . Active Member of Clubs or Organizations: Not on file  . Attends Archivist Meetings: Not on file  . Marital Status: Not on file     FAMILY HISTORY:  We obtained a detailed, 4-generation family history.  Significant diagnoses are listed below: Family History  Problem Relation Age of Onset  . Melanoma Brother 45  . Breast cancer Daughter 18       Stage III lobular carcinoma  . Lung cancer Brother 59  . Throat cancer Paternal Uncle        dx 59s, smoker  . Stroke Paternal Grandmother   . Breast cancer Paternal Grandmother        dx early 3s, b/l mastectomies   Shannon Griffith has three daughters (ages 20 to 79) and one son (age 69). One of her daughters was diagnosed with lobular breast cancer two years ago at the age of 11/46. Shannon Griffith had three brothers. One brother died at the age of 62 from [place]. Another brother died at the age of 91 from lung cancer and was a smoker.   Shannon Griffith mother is 78 and has a history of basal cell carcinoma diagnosed last year at the age of 50. Shannon Griffith had three maternal aunts and four maternal aunts. Her maternal grandmother died at the age of 70 and her maternal grandfather died at the age of 82. There are no other known diagnoses of cancer on the maternal side of the family.  Shannon Griffith father died at the age of 41 from Covid and did not have cancer. She had two paternal aunts and one paternal uncle. Her uncle died in his 29s from throat cancer and was a smoker. Her paternal grandmother died at the age of 74 and was diagnosed with breast cancer in her early 51s which was treated with bilateral mastectomies. Her  paternal grandfather died at the age of 62. There are no other known diagnoses of cancer on the paternal side of the family.  Shannon Griffith is aware of previous family history of genetic testing for hereditary cancer risks in her daughter, although she is not sure what was included on this testing. Patient's ancestors are of [place], [place], and [place] descent. There is no reported Ashkenazi Jewish ancestry. There is no known consanguinity.  GENETIC COUNSELING ASSESSMENT: Shannon Griffith is a 65 y.o. female with a personal and family history of breast cancer, which is somewhat suggestive of a hereditary cancer syndrome and predisposition to cancer.  We, therefore, discussed and recommended the following at today's visit.   DISCUSSION: We discussed that approximately 5-10% of breast cancer is hereditary, with most cases associated with the BRCA1 and BRCA2 genes. There are other genes that can be associated with hereditary breast cancer syndromes. These include ATM, CHEK2, PALB2, etc. We discussed that testing is beneficial for several reasons, including knowing about other cancer risks, identifying potential screening and risk-reduction options that may be appropriate, and to understand if other family members could be at risk for cancer and allow them to undergo genetic testing.   We reviewed the characteristics, features and inheritance patterns of hereditary cancer syndromes. We also discussed genetic testing, including the appropriate family members to test, the process of testing, insurance coverage and turn-around-time for results. We discussed the implications of a negative, positive and/or variant of uncertain significant result. We recommended Shannon Griffith pursue genetic testing for the Common Hereditary Cancers panel.   The Common Hereditary Cancers Panel offered by [facility] includes sequencing and/or deletion duplication testing of the following 48 genes: APC, ATM, AXIN2, BARD1, BMPR1A, BRCA1, BRCA2, BRIP1,  CDH1, CDK4, CDKN2A (p14ARF), CDKN2A (p16INK4a), CHEK2, CTNNA1, DICER1, EPCAM (Deletion/duplication testing only), GREM1 (promoter region deletion/duplication testing only), KIT, MEN1, MLH1, MSH2, MSH3, MSH6, MUTYH, NBN, NF1, NTHL1, PALB2, PDGFRA, PMS2, POLD1, POLE, PTEN, RAD50, RAD51C, RAD51D, RNF43, SDHB, SDHC, SDHD, SMAD4, SMARCA4. STK11, TP53, TSC1, TSC2, and VHL.  The following genes were evaluated for sequence changes only: SDHA and HOXB13 c.251G>A variant only.  Based on Ms. Ballester's personal and family history of cancer, she meets medical criteria for genetic testing. Despite that she meets criteria, there may still be an out of pocket cost. We discussed that if her out of pocket cost for testing is over $100, the laboratory will reach out to let her know. If the out of pocket cost of testing is less than $100 she will be billed by the genetic testing laboratory.   PLAN: After considering the risks, benefits, and limitations, Shannon Griffith provided informed consent to pursue genetic testing and the blood sample was sent to [facility] for analysis of the Common Hereditary Cancers Panel. Results should be available within approximately two-three weeks' time, at which point they will be disclosed by telephone to Shannon Griffith, as will any additional recommendations warranted by these results. Shannon Griffith will receive a summary of her genetic counseling visit and a copy of her results once available. This information will also be available in Epic.   Shannon Griffith questions were answered to her satisfaction today. Our contact information was provided should additional questions or concerns arise. Thank you for the referral and allowing [place] to share in the care of your patient.   Clint Guy, Maria Antonia, [facility] Licensed, Certified Dispensing [email or web address] Phone: [phone]  The patient was seen for a total of 40 minutes in face-to-face genetic counseling.  This patient was discussed with  Drs. Magrinat, Lindi Adie and/or Burr Medico who agrees with the above.    _______________________________________________________________________ For Office Staff:  Number of people involved in session: 1 Was an Intern/ student involved with case: no

## 2020-06-13 ENCOUNTER — Other Ambulatory Visit (HOSPITAL_COMMUNITY)
Admission: RE | Admit: 2020-06-13 | Discharge: 2020-06-13 | Disposition: A | Discharge status: Home / Self Care | Payer: Medicare HMO | Source: Ambulatory Visit | Attending: Surgery | Admitting: Surgery

## 2020-06-13 DIAGNOSIS — Z20822 Contact with and (suspected) exposure to covid-19: Secondary | ICD-10-CM | POA: Insufficient documentation

## 2020-06-13 DIAGNOSIS — Z01812 Encounter for preprocedural laboratory examination: Secondary | ICD-10-CM | POA: Diagnosis present

## 2020-06-14 LAB — SARS CORONAVIRUS 2 (TAT 6-24 HRS): SARS Coronavirus 2: NEGATIVE

## 2020-06-16 ENCOUNTER — Encounter (HOSPITAL_BASED_OUTPATIENT_CLINIC_OR_DEPARTMENT_OTHER)
Admission: RE | Admit: 2020-06-16 | Discharge: 2020-06-16 | Disposition: A | Discharge status: Home / Self Care | Payer: Medicare HMO | Source: Ambulatory Visit | Attending: Surgery | Admitting: Surgery

## 2020-06-16 DIAGNOSIS — Z01812 Encounter for preprocedural laboratory examination: Secondary | ICD-10-CM | POA: Insufficient documentation

## 2020-06-16 LAB — CBC WITH DIFFERENTIAL/PLATELET
Abs Immature Granulocytes: 0 10*3/uL (ref 0.00–0.07)
Basophils Absolute: 0 10*3/uL (ref 0.0–0.1)
Basophils Relative: 0 %
Eosinophils Absolute: 0.1 10*3/uL (ref 0.0–0.5)
Eosinophils Relative: 3 %
HCT: 40.3 % (ref 36.0–46.0)
Hemoglobin: 12.9 g/dL (ref 12.0–15.0)
Immature Granulocytes: 0 %
Lymphocytes Relative: 35 %
Lymphs Abs: 1.7 10*3/uL (ref 0.7–4.0)
MCH: 29.6 pg (ref 26.0–34.0)
MCHC: 32 g/dL (ref 30.0–36.0)
MCV: 92.4 fL (ref 80.0–100.0)
Monocytes Absolute: 0.4 10*3/uL (ref 0.1–1.0)
Monocytes Relative: 9 %
Neutro Abs: 2.6 10*3/uL (ref 1.7–7.7)
Neutrophils Relative %: 53 %
Platelets: 264 10*3/uL (ref 150–400)
RBC: 4.36 MIL/uL (ref 3.87–5.11)
RDW: 12.8 % (ref 11.5–15.5)
WBC: 4.9 10*3/uL (ref 4.0–10.5)
nRBC: 0 % (ref 0.0–0.2)

## 2020-06-16 LAB — COMPREHENSIVE METABOLIC PANEL
ALT: 25 U/L (ref 0–44)
AST: 24 U/L (ref 15–41)
Albumin: 4.3 g/dL (ref 3.5–5.0)
Alkaline Phosphatase: 81 U/L (ref 38–126)
Anion gap: 8 (ref 5–15)
BUN: 11 mg/dL (ref 8–23)
CO2: 29 mmol/L (ref 22–32)
Calcium: 9.5 mg/dL (ref 8.9–10.3)
Chloride: 104 mmol/L (ref 98–111)
Creatinine, Ser: 0.74 mg/dL (ref 0.44–1.00)
GFR, Estimated: >60 mL/min (ref >60)
Glucose, Bld: 113 mg/dL — ABNORMAL HIGH (ref 70–99)
Potassium: 4.7 mmol/L (ref 3.5–5.1)
Sodium: 141 mmol/L (ref 135–145)
Total Bilirubin: 0.5 mg/dL (ref 0.3–1.2)
Total Protein: 7.3 g/dL (ref 6.5–8.1)

## 2020-06-16 NOTE — Progress Notes (Signed)

## 2020-06-16 NOTE — Progress Notes (Signed)
Left message for pt to come in for lab work.

## 2020-06-17 ENCOUNTER — Encounter (HOSPITAL_BASED_OUTPATIENT_CLINIC_OR_DEPARTMENT_OTHER)
Admission: RE | Disposition: A | Discharge status: Home / Self Care | Payer: Self-pay | Source: Home / Self Care | Attending: Surgery

## 2020-06-17 ENCOUNTER — Ambulatory Visit (HOSPITAL_BASED_OUTPATIENT_CLINIC_OR_DEPARTMENT_OTHER): Payer: Medicare HMO | Admitting: Certified Registered Nurse Anesthetist

## 2020-06-17 ENCOUNTER — Ambulatory Visit (HOSPITAL_COMMUNITY)
Admission: RE | Admit: 2020-06-17 | Discharge: 2020-06-17 | Disposition: A | Discharge status: Home / Self Care | Payer: Medicare HMO | Source: Ambulatory Visit | Attending: Surgery | Admitting: Surgery

## 2020-06-17 ENCOUNTER — Ambulatory Visit (HOSPITAL_BASED_OUTPATIENT_CLINIC_OR_DEPARTMENT_OTHER)
Admission: RE | Admit: 2020-06-17 | Discharge: 2020-06-17 | Disposition: A | Discharge status: Home / Self Care | Payer: Medicare HMO | Attending: Surgery | Admitting: Surgery

## 2020-06-17 ENCOUNTER — Other Ambulatory Visit: Payer: Self-pay

## 2020-06-17 ENCOUNTER — Encounter (HOSPITAL_BASED_OUTPATIENT_CLINIC_OR_DEPARTMENT_OTHER): Payer: Self-pay | Admitting: Surgery

## 2020-06-17 DIAGNOSIS — Z79899 Other long term (current) drug therapy: Secondary | ICD-10-CM | POA: Insufficient documentation

## 2020-06-17 DIAGNOSIS — C50412 Malignant neoplasm of upper-outer quadrant of left female breast: Secondary | ICD-10-CM | POA: Insufficient documentation

## 2020-06-17 DIAGNOSIS — C50511 Malignant neoplasm of lower-outer quadrant of right female breast: Secondary | ICD-10-CM | POA: Diagnosis not present

## 2020-06-17 DIAGNOSIS — C50912 Malignant neoplasm of unspecified site of left female breast: Secondary | ICD-10-CM

## 2020-06-17 HISTORY — PX: BREAST LUMPECTOMY WITH RADIOACTIVE SEED AND SENTINEL LYMPH NODE BIOPSY: SHX6550

## 2020-06-17 SURGERY — BREAST LUMPECTOMY WITH RADIOACTIVE SEED AND SENTINEL LYMPH NODE BIOPSY
Anesthesia: General | Site: Breast | Laterality: Bilateral

## 2020-06-17 MED ORDER — OXYCODONE HCL 5 MG PO TABS
ORAL_TABLET | ORAL | Status: AC
  Filled 2020-06-17: qty 1

## 2020-06-17 MED ORDER — EPHEDRINE 5 MG/ML INJ
INTRAVENOUS | Status: AC
  Filled 2020-06-17: qty 10

## 2020-06-17 MED ORDER — PHENYLEPHRINE 40 MCG/ML (10ML) SYRINGE FOR IV PUSH (FOR BLOOD PRESSURE SUPPORT)
PREFILLED_SYRINGE | INTRAVENOUS | Status: DC | PRN
  Administered 2020-06-17: 120 ug via INTRAVENOUS
  Administered 2020-06-17 (×2): 80 ug via INTRAVENOUS
  Administered 2020-06-17 (×2): 120 ug via INTRAVENOUS
  Administered 2020-06-17: 80 ug via INTRAVENOUS
  Administered 2020-06-17: 120 ug via INTRAVENOUS
  Administered 2020-06-17: 80 ug via INTRAVENOUS

## 2020-06-17 MED ORDER — IBUPROFEN 800 MG PO TABS: 800.0000 mg | ORAL_TABLET | Freq: Three times a day (TID) | ORAL | 0 refills | Status: DC | PRN

## 2020-06-17 MED ORDER — CEFAZOLIN SODIUM-DEXTROSE 2-4 GM/100ML-% IV SOLN
INTRAVENOUS | Status: AC
  Filled 2020-06-17: qty 100

## 2020-06-17 MED ORDER — GABAPENTIN 300 MG PO CAPS
300.0000 mg | ORAL_CAPSULE | ORAL | Status: AC
  Administered 2020-06-17: 300 mg via ORAL

## 2020-06-17 MED ORDER — MIDAZOLAM HCL 2 MG/2ML IJ SOLN
2.0000 mg | Freq: Once | INTRAMUSCULAR | Status: AC
  Administered 2020-06-17: 2 mg via INTRAVENOUS

## 2020-06-17 MED ORDER — FENTANYL CITRATE (PF) 100 MCG/2ML IJ SOLN
INTRAMUSCULAR | Status: DC | PRN
  Administered 2020-06-17: 25 ug via INTRAVENOUS

## 2020-06-17 MED ORDER — SODIUM CHLORIDE (PF) 0.9 % IJ SOLN
INTRAMUSCULAR | Status: AC
  Filled 2020-06-17: qty 10

## 2020-06-17 MED ORDER — LACTATED RINGERS IV SOLN: INTRAVENOUS | Status: DC

## 2020-06-17 MED ORDER — GABAPENTIN 300 MG PO CAPS
ORAL_CAPSULE | ORAL | Status: AC
  Filled 2020-06-17: qty 1

## 2020-06-17 MED ORDER — HYDROMORPHONE HCL 1 MG/ML IJ SOLN: 0.2500 mg | INTRAMUSCULAR | Status: DC | PRN

## 2020-06-17 MED ORDER — BUPIVACAINE HCL (PF) 0.25 % IJ SOLN
INTRAMUSCULAR | Status: AC
  Filled 2020-06-17: qty 30

## 2020-06-17 MED ORDER — OXYCODONE HCL 5 MG/5ML PO SOLN: 5.0000 mg | Freq: Once | ORAL | Status: AC | PRN

## 2020-06-17 MED ORDER — ACETAMINOPHEN 500 MG PO TABS
1000.0000 mg | ORAL_TABLET | ORAL | Status: AC
  Administered 2020-06-17: 1000 mg via ORAL

## 2020-06-17 MED ORDER — FENTANYL CITRATE (PF) 100 MCG/2ML IJ SOLN
100.0000 ug | Freq: Once | INTRAMUSCULAR | Status: AC
  Administered 2020-06-17: 100 ug via INTRAVENOUS

## 2020-06-17 MED ORDER — SODIUM CHLORIDE 0.9 % IV SOLN
INTRAVENOUS | Status: DC | PRN
  Administered 2020-06-17: 40 ug/min via INTRAVENOUS

## 2020-06-17 MED ORDER — FENTANYL CITRATE (PF) 100 MCG/2ML IJ SOLN
INTRAMUSCULAR | Status: AC
  Filled 2020-06-17: qty 2

## 2020-06-17 MED ORDER — BUPIVACAINE HCL 0.25 % IJ SOLN
INTRAMUSCULAR | Status: DC | PRN
  Administered 2020-06-17: 18 mL

## 2020-06-17 MED ORDER — OXYCODONE HCL 5 MG PO TABS
5.0000 mg | ORAL_TABLET | Freq: Once | ORAL | Status: AC | PRN
  Administered 2020-06-17: 5 mg via ORAL

## 2020-06-17 MED ORDER — MIDAZOLAM HCL 2 MG/2ML IJ SOLN
INTRAMUSCULAR | Status: AC
  Filled 2020-06-17: qty 2

## 2020-06-17 MED ORDER — LIDOCAINE HCL (CARDIAC) PF 100 MG/5ML IV SOSY
PREFILLED_SYRINGE | INTRAVENOUS | Status: DC | PRN
  Administered 2020-06-17: 50 mg via INTRAVENOUS

## 2020-06-17 MED ORDER — TECHNETIUM TC 99M TILMANOCEPT KIT
1.0000 | PACK | Freq: Once | INTRAVENOUS | Status: AC | PRN
  Administered 2020-06-17: 1 via INTRADERMAL

## 2020-06-17 MED ORDER — OXYCODONE HCL 5 MG PO TABS: 5.0000 mg | ORAL_TABLET | Freq: Four times a day (QID) | ORAL | 0 refills | Status: DC | PRN

## 2020-06-17 MED ORDER — CHLORHEXIDINE GLUCONATE CLOTH 2 % EX PADS: 6.0000 | MEDICATED_PAD | Freq: Once | CUTANEOUS | Status: DC

## 2020-06-17 MED ORDER — EPHEDRINE SULFATE 50 MG/ML IJ SOLN
INTRAMUSCULAR | Status: DC | PRN
  Administered 2020-06-17: 15 mg via INTRAVENOUS
  Administered 2020-06-17: 10 mg via INTRAVENOUS
  Administered 2020-06-17: 15 mg via INTRAVENOUS
  Administered 2020-06-17: 10 mg via INTRAVENOUS
  Administered 2020-06-17: 15 mg via INTRAVENOUS

## 2020-06-17 MED ORDER — ONDANSETRON HCL 4 MG/2ML IJ SOLN
INTRAMUSCULAR | Status: AC
  Filled 2020-06-17: qty 2

## 2020-06-17 MED ORDER — DEXAMETHASONE SODIUM PHOSPHATE 4 MG/ML IJ SOLN
INTRAMUSCULAR | Status: DC | PRN
  Administered 2020-06-17: 5 mg via INTRAVENOUS

## 2020-06-17 MED ORDER — ONDANSETRON HCL 4 MG/2ML IJ SOLN
INTRAMUSCULAR | Status: DC | PRN
  Administered 2020-06-17: 4 mg via INTRAVENOUS

## 2020-06-17 MED ORDER — METHYLENE BLUE 0.5 % INJ SOLN
INTRAVENOUS | Status: AC
  Filled 2020-06-17: qty 10

## 2020-06-17 MED ORDER — ACETAMINOPHEN 500 MG PO TABS
ORAL_TABLET | ORAL | Status: AC
  Filled 2020-06-17: qty 2

## 2020-06-17 MED ORDER — CEFAZOLIN SODIUM-DEXTROSE 2-4 GM/100ML-% IV SOLN
2.0000 g | INTRAVENOUS | Status: AC
  Administered 2020-06-17: 2 g via INTRAVENOUS

## 2020-06-17 MED ORDER — PROMETHAZINE HCL 25 MG/ML IJ SOLN: 6.2500 mg | INTRAMUSCULAR | Status: DC | PRN

## 2020-06-17 MED ORDER — PROPOFOL 10 MG/ML IV BOLUS
INTRAVENOUS | Status: DC | PRN
  Administered 2020-06-17: 100 mg via INTRAVENOUS

## 2020-06-17 SURGICAL SUPPLY — 43 items
ADH SKN CLS APL DERMABOND .7 (GAUZE/BANDAGES/DRESSINGS) ×1
APL PRP STRL LF DISP 70% ISPRP (MISCELLANEOUS) ×1
APPLIER CLIP 9.375 MED OPEN (MISCELLANEOUS) ×2
APR CLP MED 9.3 20 MLT OPN (MISCELLANEOUS) ×1
BINDER BREAST LRG (GAUZE/BANDAGES/DRESSINGS) IMPLANT
BINDER BREAST XLRG (GAUZE/BANDAGES/DRESSINGS) ×2 IMPLANT
BLADE SURG 15 STRL LF DISP TIS (BLADE) ×1 IMPLANT
BLADE SURG 15 STRL SS (BLADE) ×2
CANISTER SUC SOCK COL 7IN (MISCELLANEOUS) ×2 IMPLANT
CANISTER SUCT 1200ML W/VALVE (MISCELLANEOUS) ×2 IMPLANT
CHLORAPREP W/TINT 26 (MISCELLANEOUS) ×2 IMPLANT
CLIP APPLIE 9.375 MED OPEN (MISCELLANEOUS) ×1 IMPLANT
COVER BACK TABLE 60X90IN (DRAPES) ×2 IMPLANT
COVER MAYO STAND STRL (DRAPES) ×2 IMPLANT
COVER PROBE W GEL 5X96 (DRAPES) ×2 IMPLANT
DERMABOND ADVANCED (GAUZE/BANDAGES/DRESSINGS) ×1
DERMABOND ADVANCED .7 DNX12 (GAUZE/BANDAGES/DRESSINGS) ×1 IMPLANT
DRAPE LAPAROSCOPIC ABDOMINAL (DRAPES) ×2 IMPLANT
DRAPE UTILITY XL STRL (DRAPES) ×2 IMPLANT
ELECT COATED BLADE 2.86 ST (ELECTRODE) ×2 IMPLANT
ELECT REM PT RETURN 9FT ADLT (ELECTROSURGICAL) ×2
ELECTRODE REM PT RTRN 9FT ADLT (ELECTROSURGICAL) ×1 IMPLANT
GLOVE BIOGEL PI IND STRL 8 (GLOVE) ×1 IMPLANT
GLOVE BIOGEL PI INDICATOR 8 (GLOVE) ×1
GLOVE ECLIPSE 8.0 STRL XLNG CF (GLOVE) ×4 IMPLANT
GOWN STRL REUS W/ TWL LRG LVL3 (GOWN DISPOSABLE) ×3 IMPLANT
GOWN STRL REUS W/TWL LRG LVL3 (GOWN DISPOSABLE) ×6
HEMOSTAT ARISTA ABSORB 3G PWDR (HEMOSTASIS) IMPLANT
HEMOSTAT SNOW SURGICEL 2X4 (HEMOSTASIS) IMPLANT
KIT MARKER MARGIN INK (KITS) ×2 IMPLANT
NEEDLE HYPO 25X1 1.5 SAFETY (NEEDLE) ×2 IMPLANT
NS IRRIG 1000ML POUR BTL (IV SOLUTION) ×2 IMPLANT
PACK BASIN DAY SURGERY FS (CUSTOM PROCEDURE TRAY) ×2 IMPLANT
PENCIL SMOKE EVACUATOR (MISCELLANEOUS) ×2 IMPLANT
SLEEVE SCD COMPRESS KNEE MED (MISCELLANEOUS) ×2 IMPLANT
SPONGE LAP 4X18 RFD (DISPOSABLE) ×4 IMPLANT
SUT MNCRL AB 4-0 PS2 18 (SUTURE) ×4 IMPLANT
SUT VICRYL 3-0 CR8 SH (SUTURE) ×4 IMPLANT
SYR CONTROL 10ML LL (SYRINGE) ×2 IMPLANT
TOWEL GREEN STERILE FF (TOWEL DISPOSABLE) ×2 IMPLANT
TRAY FAXITRON CT DISP (TRAY / TRAY PROCEDURE) ×4 IMPLANT
TUBE CONNECTING 20X1/4 (TUBING) ×2 IMPLANT
YANKAUER SUCT BULB TIP NO VENT (SUCTIONS) ×2 IMPLANT

## 2020-06-17 NOTE — Discharge Instructions (Signed)
[facility] °Office Phone Number [phone] ° °BREAST BIOPSY/ PARTIAL MASTECTOMY: POST OP INSTRUCTIONS ° °Always review your discharge instruction sheet given to you by the facility where your surgery was performed. ° °IF YOU HAVE DISABILITY OR FAMILY LEAVE FORMS, YOU MUST BRING THEM TO THE OFFICE FOR PROCESSING.  DO NOT GIVE THEM TO YOUR DOCTOR. ° °1. A prescription for pain medication may be given to you upon discharge.  Take your pain medication as prescribed, if needed.  If narcotic pain medicine is not needed, then you may take acetaminophen (Tylenol) or ibuprofen (Advil) as needed. °2. Take your usually prescribed medications unless otherwise directed °3. If you need a refill on your pain medication, please contact your pharmacy.  They will contact our office to request authorization.  Prescriptions will not be filled after 5pm or on week-ends. °4. You should eat very light the first 24 hours after surgery, such as soup, crackers, pudding, etc.  Resume your normal diet the day after surgery. °5. Most patients will experience some swelling and bruising in the breast.  Ice packs and a good support bra will help.  Swelling and bruising can take several days to resolve.  °6. It is common to experience some constipation if taking pain medication after surgery.  Increasing fluid intake and taking a stool softener will usually help or prevent this problem from occurring.  A mild laxative (Milk of Magnesia or Miralax) should be taken according to package directions if there are no bowel movements after 48 hours. °7. Unless discharge instructions indicate otherwise, you may remove your bandages 24-48 hours after surgery, and you may shower at that time.  You may have steri-strips (small skin tapes) in place directly over the incision.  These strips should be left on the skin for 7-10 days.  If your surgeon used skin glue on the incision, you may shower in 24 hours.  The glue will flake off over the  next 2-3 weeks.  Any sutures or staples will be removed at the office during your follow-up visit. °8. ACTIVITIES:  You may resume regular daily activities (gradually increasing) beginning the next day.  Wearing a good support bra or sports bra minimizes pain and swelling.  You may have sexual intercourse when it is comfortable. °a. You may drive when you no longer are taking prescription pain medication, you can comfortably wear a seatbelt, and you can safely maneuver your car and apply brakes. °b. RETURN TO WORK:  ______________________________________________________________________________________ °9. You should see your doctor in the office for a follow-up appointment approximately two weeks after your surgery.  Your doctor’s nurse will typically make your follow-up appointment when she calls you with your pathology report.  Expect your pathology report 2-3 business days after your surgery.  You may call to check if you do not hear from us after three days. °10. OTHER INSTRUCTIONS: _______________________________________________________________________________________________ _____________________________________________________________________________________________________________________________________ °_____________________________________________________________________________________________________________________________________ °_____________________________________________________________________________________________________________________________________ ° °WHEN TO CALL YOUR DOCTOR: °1. Fever over 101.0 °2. Nausea and/or vomiting. °3. Extreme swelling or bruising. °4. Continued bleeding from incision. °5. Increased pain, redness, or drainage from the incision. ° °The clinic staff is available to answer your questions during regular business hours.  Please don’t hesitate to call and ask to speak to one of the nurses for clinical concerns.  If you have a medical emergency, go to the nearest  emergency room or call 911.  A surgeon from [facility] is always on call at the hospital. ° °For further questions, please visit [email or web address]  °

## 2020-06-17 NOTE — Transfer of Care (Signed)
Immediate Anesthesia Transfer of Care Note  Patient: Shannon Griffith  Procedure(s) Performed: BILATERAL BREAST LUMPECTOMY WITH RADIOACTIVE SEED AND LEFT SENTINEL LYMPH NODE MAPPING (Bilateral Breast)  Patient Location: PACU  Anesthesia Type:General and Regional  Level of Consciousness: awake, alert  and oriented  Airway & Oxygen Therapy: Patient Spontanous Breathing and Patient connected to face mask oxygen  Post-op Assessment: Report given to RN and Post -op Vital signs reviewed and stable  Post vital signs: Reviewed and stable  Last Vitals:  Vitals Value Taken Time  BP 137/75 06/17/20 1549  Temp    Pulse 76 06/17/20 1550  Resp 14 06/17/20 1550  SpO2 100 % 06/17/20 1550  Vitals shown include unvalidated device data.  Last Pain:  Vitals:   06/17/20 1257  TempSrc: Oral  PainSc: 0-No pain      Patients Stated Pain Goal: 7 (69/40/98 2867)  Complications: No complications documented.

## 2020-06-17 NOTE — Anesthesia Preprocedure Evaluation (Signed)
Anesthesia Evaluation  Patient identified by MRN, date of birth, ID band Patient awake    Reviewed: Allergy & Precautions, NPO status , Patient's Chart, lab work & pertinent test results  Airway Mallampati: I  TM Distance: >3 FB Neck ROM: Full    Dental   Pulmonary    Pulmonary exam normal        Cardiovascular Normal cardiovascular exam     Neuro/Psych Anxiety Depression    GI/Hepatic   Endo/Other    Renal/GU      Musculoskeletal   Abdominal   Peds  Hematology   Anesthesia Other Findings   Reproductive/Obstetrics                             Anesthesia Physical Anesthesia Plan  ASA: II  Anesthesia Plan: General   Post-op Pain Management:  Regional for Post-op pain   Induction: Intravenous  PONV Risk Score and Plan: 3 and Ondansetron, Midazolam and Treatment may vary due to age or medical condition  Airway Management Planned: LMA  Additional Equipment:   Intra-op Plan:   Post-operative Plan: Extubation in OR  Informed Consent: I have reviewed the patients History and Physical, chart, labs and discussed the procedure including the risks, benefits and alternatives for the proposed anesthesia with the patient or authorized representative who has indicated his/her understanding and acceptance.       Plan Discussed with: CRNA and Surgeon  Anesthesia Plan Comments:         Anesthesia Quick Evaluation

## 2020-06-17 NOTE — Interval H&P Note (Signed)
History and Physical Interval Note:  06/17/2020 1:42 PM  Shannon Griffith  has presented today for surgery, with the diagnosis of LEFT BREAST CANCER.  The various methods of treatment have been discussed with the patient and family. After consideration of risks, benefits and other options for treatment, the patient has consented to  Procedure(s) with comments: LEFT BREAST LUMPECTOMY WITH RADIOACTIVE SEED AND SENTINEL LYMPH NODE MAPPING (Left) - PEC BLOCK as a surgical intervention.  The patient's history has been reviewed, patient examined, no change in status, stable for surgery.  I have reviewed the patient's chart and labs.  Questions were answered to the patient's satisfaction.   Right breast lesion needs to be excised at the same time.  Discussed wit the patient.  The procedure has been discussed with the patient. Alternatives to surgery have been discussed with the patient.  Risks of surgery include bleeding,  Infection,  Seroma formation, death,  and the need for further surgery.   The patient understands and wishes to proceed.    Turner Daniels MD

## 2020-06-17 NOTE — Progress Notes (Signed)
Assisted Dr. Ossey with left, ultrasound guided, pectoralis block. Side rails up, monitors on throughout procedure. See vital signs in flow sheet. Tolerated Procedure well. 

## 2020-06-17 NOTE — Anesthesia Postprocedure Evaluation (Signed)
Anesthesia Post Note  Patient: Shannon Griffith  Procedure(s) Performed: BILATERAL BREAST LUMPECTOMY WITH RADIOACTIVE SEED AND LEFT SENTINEL LYMPH NODE MAPPING (Bilateral Breast)     Patient location during evaluation: PACU Anesthesia Type: General and Regional Level of consciousness: awake and alert, oriented and patient cooperative Pain management: pain level controlled Vital Signs Assessment: post-procedure vital signs reviewed and stable Respiratory status: spontaneous breathing, nonlabored ventilation and respiratory function stable Cardiovascular status: blood pressure returned to baseline and stable Postop Assessment: no apparent nausea or vomiting Anesthetic complications: no   No complications documented.  Last Vitals:  Vitals:   06/17/20 1555 06/17/20 1600  BP:  (!) 134/97  Pulse: 74 77  Resp: 18 12  Temp: 36.4 C   SpO2: 98% 100%    Last Pain:  Vitals:   06/17/20 1555  TempSrc:   PainSc: [place]

## 2020-06-17 NOTE — Anesthesia Procedure Notes (Signed)
Anesthesia Regional Block: Pectoralis block   Pre-Anesthetic Checklist: ,, timeout performed, Correct Patient, Correct Site, Correct Laterality, Correct Procedure, Correct Position, site marked, Risks and benefits discussed,  Surgical consent,  Pre-op evaluation,  At surgeon's request and post-op pain management  Laterality: Left  Prep: chloraprep       Needles:  Injection technique: Single-shot     Needle Length: 9cm  Needle Gauge: 21     Additional Needles:   Narrative:  Start time: 06/17/2020 1:34 PM End time: 06/17/2020 1:44 PM Injection made incrementally with aspirations every 5 mL.  Performed by: Personally  Anesthesiologist: Lillia Abed, MD  Additional Notes: Monitors applied. Patient sedated. Sterile prep and drape,hand hygiene and sterile gloves were used. Relevant anatomy identified.Needle position confirmed.Local anesthetic injected incrementally after negative aspiration. Local anesthetic spread visualized. Vascular puncture avoided. No complications. Image printed for medical record.The patient tolerated the procedure well.

## 2020-06-17 NOTE — Op Note (Signed)
Preoperative diagnosis: Stage I left breast cancer upper outer quadrant in right breast mass  Postoperative diagnosis: Same  Procedure: Left breast seed localized lumpectomy with left axillary sentinel lymph node mapping and right breast seed localized lumpectomy  Surgeon: Erroll Luna, MD  Assistant: Dr. Sheria Lang, MD  Anesthesia: General with pectoral block in 0.25% Marcaine local  EBL: 60 cc  Specimen: Left breast tissue with seed and clip verified by Faxitron and 1 left axillary sentinel node hot and right breast tissue.  The seed was removed separately.  The clip was more lateral to the actual target which was discussed with the radiologist and was not removed at this time since he was out of the operative field.  Drains: None  IV fluids: Per anesthesia record  Indications for procedure: The patient presents for treatment of her stage I left breast cancer upper outer quadrant.  She chose breast conserving surgery and was seen in the multidisciplinary clinic.  She also had a lesion in the right breast that excision was recommended by the radiologist from initial biopsy which showed a columnar cell hyperplasia.  Risk, benefits and alternative treatment options and surgery were discussed with the patient.  Pros and cons of breast conserving surgery versus mastectomy were discussed with the patient.The procedure has been discussed with the patient. Alternatives to surgery have been discussed with the patient.  Risks of surgery include bleeding,  Infection,  Seroma formation, death,  and the need for further surgery.   The patient understands and wishes to proceed.Sentinel lymph node mapping and dissection has been discussed with the patient.  Risk of bleeding,  Infection,  Seroma formation,  Additional procedures,,  Shoulder weakness ,  Shoulder stiffness,  Nerve and blood vessel injury and reaction to the mapping dyes have been discussed.  Alternatives to surgery have been discussed with  the patient.  The patient agrees to proceed.   Description of procedure: The patient was met in the holding area and questions were answered.  Images were available for review and discussion with the radiologist was held to discuss clip placement and seed placement as outlined above.  All questions were answered.  She underwent pectoral block per anesthesia.  Neoprobe was used to verify seed location.  She underwent injection with technetium sulfur colloid in the left.  She was then taken back to the operating.  She is placed supine on the OR table.  Induction of general esthesia, right breast and left breast were prepped and draped in sterile fashion.  Neoprobe used to verify both seeds.  Right side done first.  Neoprobe used to verify seed right breast lower outer quadrant.  Curvilinear incision was made dissection was carried around all tissue and the seed were excised.  Of note the seed was removed separately.  Of note the clip was lateral to the target which was discussed preoperatively with Dr. Luan Pulling.  It was felt that the clip would not be removed with the specimen this point in time.  The tissue was Faxitron to.  This was sent to pathology after orientation with ink.  Wound was then closed with 3-0 Vicryl and 4-0 Monocryl.  Left side was done.  Neoprobe was used to identify the seed left breast upper outer quadrant.  Curvilinear incision was made over the signal.  Dissection was carried down all tissue around the seed and clip were excised with a grossly negative margin.  Faxitron revealed both seed and clip to be in the specimen.  The cavity  was made hemostatic with cautery.  Irrigation was used.  Local anesthetic infiltrated.  Cavity closed with 3-0 Vicryl and 4-0 Monocryl.  Neoprobe settings changed technetium.  Hotspot identified in the left axilla.  A 4 cm incision was made in left axilla.  Dissection was carried in the level 1 node basin.  Hot sentinel node was removed.  Background counts  approached baseline.  Irrigation was used.  Hemostasis achieved with cautery and Arista was placed.  Wound closed with 3-0 Vicryl for Monocryl after ensuring hemostasis.  Dermabond applied.  Breast binder placed.  PATIENT was awoke extubated taken recovery in satisfactory condition.

## 2020-06-17 NOTE — Progress Notes (Signed)
Nuc med inj performed by nuc med staff. Pt tol well with no additional sedation. VSS, emotional support provided.

## 2020-06-17 NOTE — Anesthesia Procedure Notes (Signed)
Procedure Name: LMA Insertion Date/Time: 06/17/2020 2:14 PM Performed by: Bufford Spikes, CRNA Pre-anesthesia Checklist: Patient identified, Emergency Drugs available, Suction available and Patient being monitored Patient Re-evaluated:Patient Re-evaluated prior to induction Oxygen Delivery Method: Circle system utilized Preoxygenation: Pre-oxygenation with 100% oxygen Induction Type: IV induction Ventilation: Mask ventilation without difficulty LMA: LMA inserted LMA Size: 4.0 Number of attempts: 1 Airway Equipment and Method: Bite block Placement Confirmation: positive ETCO2 Tube secured with: Tape Dental Injury: Teeth and Oropharynx as per pre-operative assessment

## 2020-06-18 ENCOUNTER — Encounter (HOSPITAL_BASED_OUTPATIENT_CLINIC_OR_DEPARTMENT_OTHER): Payer: Self-pay | Admitting: Surgery

## 2020-06-18 NOTE — Addendum Note (Signed)
Addendum  created 06/18/20 1208 by Lavonia Dana, CRNA   Charge Capture section accepted

## 2020-06-23 NOTE — Progress Notes (Signed)
Patient Care Team: Chesley Noon, MD as PCP - General (Family Medicine) Mauro Kaufmann, RN as Oncology Nurse Navigator Rockwell Germany, RN as Oncology Nurse Navigator  DIAGNOSIS:    ICD-10-CM   1. Malignant neoplasm of upper-outer quadrant of left breast in female, estrogen receptor positive ([place])  C50.412    Z17.0     SUMMARY OF ONCOLOGIC HISTORY: Oncology History  Malignant neoplasm of upper-outer quadrant of left breast in female, estrogen receptor positive ([place])  06/01/2020 Initial Diagnosis   Mammogram showed a 0.7cm left upper quadrant breast mass. 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     CHIEF COMPLIANT: Follow-up s/p lumpectomies  INTERVAL HISTORY: Shannon Griffith is a 64 y.o. with above-mentioned history of left breast cancer. She underwent bilateral lumpectomies on 06/17/20 with Dr. Brantley Stage. She presents to the clinic today to review the pathology report and discuss further treatment.  She is feeling significantly better times and has recovered from her surgery.  She is accompanied by her husband to discuss pathology report.  ALLERGIES:  is allergic to cephalexin, nitrofurantoin monohyd macro, and other.  MEDICATIONS:  Current Outpatient Medications  Medication Sig Dispense Refill  . albuterol (VENTOLIN HFA) 108 (90 Base) MCG/ACT inhaler Inhale into the lungs.    Marland Kitchen buPROPion (WELLBUTRIN XL) 300 MG 24 hr tablet Take 300 mg by mouth at bedtime.  3  . Cholecalciferol (VITAMIN D3) 1.25 MG (50000 UT) CAPS Take by mouth.    . ibandronate (BONIVA) 150 MG tablet Take by mouth every 30 (thirty) days.     Marland Kitchen ibuprofen (ADVIL) 800 MG tablet Take 1 tablet (800 mg total) by mouth every 8 (eight) hours as needed. 30 tablet 0  . ibuprofen (ADVIL,MOTRIN) 800 MG tablet Take 1 tablet (800 mg total) by mouth every 8 (eight) hours as needed for  moderate pain (mild pain). 30 tablet 1  . montelukast (SINGULAIR) 10 MG tablet Take by mouth.    . Multiple Vitamin (MULTIVITAMIN ADULT PO) Take by mouth.    . oxyCODONE (OXY IR/ROXICODONE) 5 MG immediate release tablet Take 1 tablet (5 mg total) by mouth every 6 (six) hours as needed for severe pain. 15 tablet 0  . QUEtiapine Fumarate (SEROQUEL XR) 150 MG 24 hr tablet Take 150 mg by mouth at bedtime.  3  . rosuvastatin (CRESTOR) 10 MG tablet Take 10 mg by mouth at bedtime.     No current facility-administered medications for this visit.    PHYSICAL EXAMINATION: ECOG PERFORMANCE STATUS: 1 - Symptomatic but completely ambulatory  Vitals:   06/24/20 1224  BP: (!) 141/92  Pulse: 87  Resp: 18  Temp: 98 F (36.7 C)  SpO2: 99%   Filed Weights   06/24/20 1224  Weight: 164 lb 11.2 oz (74.7 kg)    LABORATORY DATA:  I have reviewed the data as listed CMP Latest Ref Rng & Units 06/16/2020  Glucose 70 - 99 mg/dL 113(H)  BUN 8 - 23 mg/dL 11  Creatinine 0.44 - 1.00 mg/dL 0.74  Sodium 135 - 145 mmol/L 141  Potassium 3.5 - 5.1 mmol/L 4.7  Chloride 98 - 111 mmol/L 104  CO2 22 - 32 mmol/L 29  Calcium 8.9 - 10.3 mg/dL 9.5  Total Protein 6.5 - 8.1 g/dL 7.3  Total Bilirubin 0.3 - 1.2 mg/dL 0.5  Alkaline Phos 38 - 126  U/L 81  AST 15 - 41 U/L 24  ALT 0 - 44 U/L 25    Lab Results  Component Value Date   WBC 4.9 06/16/2020   HGB 12.9 06/16/2020   HCT 40.3 06/16/2020   MCV 92.4 06/16/2020   PLT 264 06/16/2020   NEUTROABS 2.6 06/16/2020    ASSESSMENT & PLAN:  Malignant neoplasm of upper-outer quadrant of left breast in female, estrogen receptor positive ([place]) Mammogram showed a 0.7cm left upper quadrant breast mass. 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  Possible reexcision of the superior margin on the left 2. sentinel lymph node biopsy in the right 3.  Oncotype DX testing on the left 4.  Prognostic panel on the right Discussed the case in the tumor board and come up with the treatment plan.   No orders of the defined types were placed in this encounter.  The patient has a good understanding of the overall plan. she agrees with it. she will call with any problems that may develop before the next visit here.  Total time spent: 20 mins including face to face time and time spent for planning, charting and coordination of care  Nicholas Lose, MD 06/24/2020  I, Cloyde Reams Dorshimer, am acting as scribe for Dr. Nicholas Lose.  I have reviewed the above documentation for accuracy and completeness, and I agree with the above.

## 2020-06-24 ENCOUNTER — Inpatient Hospital Stay: Payer: Medicare HMO | Attending: Hematology and Oncology | Admitting: Hematology and Oncology

## 2020-06-24 ENCOUNTER — Encounter: Payer: Self-pay | Admitting: *Deleted

## 2020-06-24 ENCOUNTER — Other Ambulatory Visit: Payer: Self-pay

## 2020-06-24 ENCOUNTER — Telehealth: Payer: Self-pay | Admitting: *Deleted

## 2020-06-24 DIAGNOSIS — C50412 Malignant neoplasm of upper-outer quadrant of left female breast: Secondary | ICD-10-CM

## 2020-06-24 DIAGNOSIS — Z170 Estrogen receptor positive status [ER+]: Secondary | ICD-10-CM | POA: Insufficient documentation

## 2020-06-24 DIAGNOSIS — Z1379 Encounter for other screening for genetic and chromosomal anomalies: Secondary | ICD-10-CM | POA: Insufficient documentation

## 2020-06-24 NOTE — Telephone Encounter (Signed)
Received order for oncotype testing. Requisition faxed to pathology and [facility] °

## 2020-06-24 NOTE — Assessment & Plan Note (Signed)
Mammogram showed a 0.7cm left upper quadrant breast mass. 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

## 2020-06-25 ENCOUNTER — Ambulatory Visit: Payer: Self-pay | Admitting: Surgery

## 2020-06-25 DIAGNOSIS — C50911 Malignant neoplasm of unspecified site of right female breast: Secondary | ICD-10-CM

## 2020-06-26 ENCOUNTER — Ambulatory Visit: Payer: Self-pay | Admitting: Genetic Counselor

## 2020-06-26 ENCOUNTER — Encounter (HOSPITAL_BASED_OUTPATIENT_CLINIC_OR_DEPARTMENT_OTHER): Payer: Self-pay | Admitting: Surgery

## 2020-06-26 ENCOUNTER — Encounter: Payer: Self-pay | Admitting: Genetic Counselor

## 2020-06-26 ENCOUNTER — Telehealth: Payer: Self-pay | Admitting: Genetic Counselor

## 2020-06-26 ENCOUNTER — Other Ambulatory Visit: Payer: Self-pay

## 2020-06-26 DIAGNOSIS — Z1379 Encounter for other screening for genetic and chromosomal anomalies: Secondary | ICD-10-CM

## 2020-06-26 NOTE — Progress Notes (Signed)

## 2020-06-26 NOTE — Telephone Encounter (Signed)
Revealed negative genetic testing. Discussed that we do not know why she has breast cancer or why there is cancer in the family. It is possible that there could also be a mutation in a different gene that we are not testing, or our current technology may not be able detect certain mutations. It will therefore be important for her to stay in contact with genetics to keep up with whether additional testing may be appropriate in the future.

## 2020-06-26 NOTE — Progress Notes (Signed)
HPI:  [name] was previously seen in the [facility] clinic due to a personal and family history of cancer and concerns regarding a hereditary predisposition to cancer. Please refer to our prior cancer genetics clinic note for more information regarding our discussion, assessment and recommendations, at the time. [name] recent genetic test results were disclosed to her, as were recommendations warranted by these results. These results and recommendations are discussed in more detail below.  CANCER HISTORY:  Oncology History  Malignant neoplasm of upper-outer quadrant of left breast in female, estrogen receptor positive ([place])  06/01/2020 Initial Diagnosis   Mammogram showed a 0.7cm left upper quadrant breast mass. 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   06/24/2020 Genetic Testing   Negative genetic testing:  No pathogenic variants detected on the [facility] Common Hereditary Cancers panel. The report date is 06/24/2020.   The Common Hereditary Cancers Panel offered by [facility] includes sequencing and/or deletion duplication testing of the following 48 genes: APC, ATM, AXIN2, BARD1, BMPR1A, BRCA1, BRCA2, BRIP1, CDH1, CDK4, CDKN2A (p14ARF), CDKN2A (p16INK4a), CHEK2, CTNNA1, DICER1, EPCAM (Deletion/duplication testing only), GREM1 (promoter region deletion/duplication testing only), KIT, MEN1, MLH1, MSH2, MSH3, MSH6, MUTYH, NBN, NF1, NTHL1, PALB2, PDGFRA, PMS2, POLD1, POLE, PTEN, RAD50, RAD51C, RAD51D, RNF43, SDHB, SDHC, SDHD, SMAD4, SMARCA4. STK11, TP53, TSC1, TSC2, and VHL.  The following genes were evaluated for sequence changes only: SDHA and HOXB13 c.251G>A variant only.     FAMILY HISTORY:  We obtained a detailed, 4-generation family history.  Significant diagnoses are listed below: Family History  Problem Relation Age of Onset    Melanoma Brother 24   Breast cancer Daughter 46       Stage III lobular carcinoma   Lung cancer Brother 33   Throat cancer Paternal Uncle        dx 43s, smoker   Stroke Paternal Grandmother    Breast cancer Paternal Grandmother        dx early 67s, b/l mastectomies   [name] has three daughters (ages 69 to 38) and one son (age 33). One of her daughters was diagnosed with lobular breast cancer two years ago at the age of 37/46. [name] had three brothers. One brother died at the age of 32 from [place]. Another brother died at the age of 56 from lung cancer and was a smoker.   [name] mother is 58 and has a history of basal cell carcinoma diagnosed last year at the age of 75. [name] had three maternal aunts and four maternal aunts. Her maternal grandmother died at the age of 31 and her maternal grandfather died at the age of 40. There are no other known diagnoses of cancer on the maternal side of the family.  [name] father died at the age of 59 from Covid and did not have cancer. She had two paternal aunts and one paternal uncle. Her uncle died in his 17s from throat cancer and was a smoker. Her paternal grandmother died at the age of 26 and was diagnosed with breast cancer in her early 61s which was treated with bilateral mastectomies. Her paternal grandfather died at the age of 29. There are no other known diagnoses of cancer on the paternal side of the family.  [name] is aware of previous family history of genetic testing for hereditary cancer risks  in her daughter, although she is not sure what was included on this testing. Patient's ancestors are of [place], [place], and [place] descent. There is no reported Ashkenazi Jewish ancestry. There is no known consanguinity.  GENETIC TEST RESULTS: Genetic testing reported out on 06/24/2020 through the [facility] Common Hereditary Cancers panel. No pathogenic variants were detected.   The Common Hereditary Cancers Panel offered  by [facility] includes sequencing and/or deletion duplication testing of the following 48 genes: APC, ATM, AXIN2, BARD1, BMPR1A, BRCA1, BRCA2, BRIP1, CDH1, CDK4, CDKN2A (p14ARF), CDKN2A (p16INK4a), CHEK2, CTNNA1, DICER1, EPCAM (Deletion/duplication testing only), GREM1 (promoter region deletion/duplication testing only), KIT, MEN1, MLH1, MSH2, MSH3, MSH6, MUTYH, NBN, NF1, NTHL1, PALB2, PDGFRA, PMS2, POLD1, POLE, PTEN, RAD50, RAD51C, RAD51D, RNF43, SDHB, SDHC, SDHD, SMAD4, SMARCA4. STK11, TP53, TSC1, TSC2, and VHL.  The following genes were evaluated for sequence changes only: SDHA and HOXB13 c.251G>A variant only. The test report will be scanned into EPIC and located under the Molecular Pathology section of the Results Review tab.  A portion of the result report is included below for reference.     We discussed with [name] that because current genetic testing is not perfect, it is possible there may be a gene mutation in one of these genes that current testing cannot detect, but that chance is small.  We also discussed that there could be another gene that has not yet been discovered, or that we have not yet tested, that is responsible for the cancer diagnoses in the family. It is also possible there is a hereditary cause for the cancer in the family that [name] did not inherit and therefore was not identified in her testing.  Therefore, it is important to remain in touch with cancer genetics in the future so that we can continue to offer [name] the most up to date genetic testing.   CANCER SCREENING RECOMMENDATIONS: [name] test result is considered negative (normal).  This means that we have not identified a hereditary cause for her personal and family history of cancer at this time. While reassuring, this does not definitively rule out a hereditary predisposition to cancer. It is still possible that there could be genetic mutations that are undetectable by current technology. There could be  genetic mutations in genes that have not been tested or identified to increase cancer risk.  Therefore, it is recommended she continue to follow the cancer management and screening guidelines provided by her oncology and primary healthcare provider.   An individual's cancer risk and medical management are not determined by genetic test results alone. Overall cancer risk assessment incorporates additional factors, including personal medical history, family history, and any available genetic information that may result in a personalized plan for cancer prevention and surveillance.  RECOMMENDATIONS FOR FAMILY MEMBERS:  Individuals in this family might be at some increased risk of developing cancer, over the general population risk, simply due to the family history of cancer.  We recommended women in this family have a yearly mammogram beginning at age 69, or 62 years younger than the earliest onset of cancer, an annual clinical breast exam, and perform monthly breast self-exams. Women in this family should also have a gynecological exam as recommended by their primary provider. All family members should be referred for colonoscopy starting at age 64.  FOLLOW-UP: Lastly, we discussed with [name] that cancer genetics is a rapidly advancing field and it is possible that new genetic tests will be appropriate for her and/or her family members  in the future. We encouraged her to remain in contact with cancer genetics on an annual basis so we can update her personal and family histories and let her know of advances in cancer genetics that may benefit this family.   Our contact number was provided. [name] questions were answered to her satisfaction, and she knows she is welcome to call us at anytime with additional questions or concerns.   Clint Guy, MS, [facility] Genetic Counselor [email or web address] Phone: [phone]

## 2020-06-27 ENCOUNTER — Other Ambulatory Visit (HOSPITAL_COMMUNITY)
Admission: RE | Admit: 2020-06-27 | Discharge: 2020-06-27 | Disposition: A | Discharge status: Home / Self Care | Payer: Medicare HMO | Source: Ambulatory Visit | Attending: Surgery | Admitting: Surgery

## 2020-06-27 DIAGNOSIS — Z01812 Encounter for preprocedural laboratory examination: Secondary | ICD-10-CM | POA: Insufficient documentation

## 2020-06-27 DIAGNOSIS — Z20822 Contact with and (suspected) exposure to covid-19: Secondary | ICD-10-CM | POA: Diagnosis not present

## 2020-06-27 LAB — SARS CORONAVIRUS 2 (TAT 6-24 HRS): SARS Coronavirus 2: NEGATIVE

## 2020-06-29 ENCOUNTER — Telehealth: Payer: Self-pay | Admitting: Hematology and Oncology

## 2020-06-29 NOTE — Telephone Encounter (Signed)
No 11/10 los, no changes made to pt schedule  

## 2020-06-30 ENCOUNTER — Ambulatory Visit (HOSPITAL_COMMUNITY)
Admission: RE | Admit: 2020-06-30 | Discharge: 2020-06-30 | Disposition: A | Discharge status: Home / Self Care | Payer: Medicare HMO | Source: Ambulatory Visit | Attending: Surgery | Admitting: Surgery

## 2020-06-30 ENCOUNTER — Ambulatory Visit (HOSPITAL_BASED_OUTPATIENT_CLINIC_OR_DEPARTMENT_OTHER): Payer: Medicare HMO | Admitting: Certified Registered"

## 2020-06-30 ENCOUNTER — Encounter (HOSPITAL_BASED_OUTPATIENT_CLINIC_OR_DEPARTMENT_OTHER)
Admission: RE | Disposition: A | Discharge status: Home / Self Care | Payer: Self-pay | Source: Home / Self Care | Attending: Surgery

## 2020-06-30 ENCOUNTER — Ambulatory Visit (HOSPITAL_BASED_OUTPATIENT_CLINIC_OR_DEPARTMENT_OTHER)
Admission: RE | Admit: 2020-06-30 | Discharge: 2020-06-30 | Disposition: A | Discharge status: Home / Self Care | Payer: Medicare HMO | Attending: Surgery | Admitting: Surgery

## 2020-06-30 ENCOUNTER — Other Ambulatory Visit: Payer: Self-pay

## 2020-06-30 ENCOUNTER — Encounter (HOSPITAL_BASED_OUTPATIENT_CLINIC_OR_DEPARTMENT_OTHER): Payer: Self-pay | Admitting: Surgery

## 2020-06-30 DIAGNOSIS — Z888 Allergy status to other drugs, medicaments and biological substances status: Secondary | ICD-10-CM | POA: Insufficient documentation

## 2020-06-30 DIAGNOSIS — Z79899 Other long term (current) drug therapy: Secondary | ICD-10-CM | POA: Diagnosis not present

## 2020-06-30 DIAGNOSIS — C50411 Malignant neoplasm of upper-outer quadrant of right female breast: Secondary | ICD-10-CM | POA: Insufficient documentation

## 2020-06-30 DIAGNOSIS — C50911 Malignant neoplasm of unspecified site of right female breast: Secondary | ICD-10-CM

## 2020-06-30 DIAGNOSIS — Z9049 Acquired absence of other specified parts of digestive tract: Secondary | ICD-10-CM | POA: Insufficient documentation

## 2020-06-30 DIAGNOSIS — Z90710 Acquired absence of both cervix and uterus: Secondary | ICD-10-CM | POA: Insufficient documentation

## 2020-06-30 DIAGNOSIS — Z90722 Acquired absence of ovaries, bilateral: Secondary | ICD-10-CM | POA: Insufficient documentation

## 2020-06-30 DIAGNOSIS — Z881 Allergy status to other antibiotic agents status: Secondary | ICD-10-CM | POA: Insufficient documentation

## 2020-06-30 DIAGNOSIS — Z803 Family history of malignant neoplasm of breast: Secondary | ICD-10-CM | POA: Diagnosis not present

## 2020-06-30 DIAGNOSIS — Z170 Estrogen receptor positive status [ER+]: Secondary | ICD-10-CM

## 2020-06-30 DIAGNOSIS — C50912 Malignant neoplasm of unspecified site of left female breast: Secondary | ICD-10-CM | POA: Insufficient documentation

## 2020-06-30 HISTORY — DX: Hyperlipidemia, unspecified: E78.5

## 2020-06-30 HISTORY — PX: RE-EXCISION OF BREAST LUMPECTOMY: SHX6048

## 2020-06-30 HISTORY — PX: AXILLARY LYMPH NODE BIOPSY: SHX5737

## 2020-06-30 SURGERY — EXCISION, LESION, BREAST
Anesthesia: General | Site: Breast | Laterality: Right

## 2020-06-30 MED ORDER — ONDANSETRON HCL 4 MG/2ML IJ SOLN
INTRAMUSCULAR | Status: AC
  Filled 2020-06-30: qty 2

## 2020-06-30 MED ORDER — ACETAMINOPHEN 500 MG PO TABS
ORAL_TABLET | ORAL | Status: AC
  Filled 2020-06-30: qty 2

## 2020-06-30 MED ORDER — LACTATED RINGERS IV SOLN: INTRAVENOUS | Status: DC

## 2020-06-30 MED ORDER — CELECOXIB 200 MG PO CAPS
200.0000 mg | ORAL_CAPSULE | ORAL | Status: AC
  Administered 2020-06-30: 200 mg via ORAL

## 2020-06-30 MED ORDER — OXYCODONE HCL 5 MG PO TABS
5.0000 mg | ORAL_TABLET | Freq: Once | ORAL | Status: AC | PRN
  Administered 2020-06-30: 5 mg via ORAL

## 2020-06-30 MED ORDER — CELECOXIB 200 MG PO CAPS
ORAL_CAPSULE | ORAL | Status: AC
  Filled 2020-06-30: qty 1

## 2020-06-30 MED ORDER — MEPERIDINE HCL 25 MG/ML IJ SOLN: 6.2500 mg | INTRAMUSCULAR | Status: DC | PRN

## 2020-06-30 MED ORDER — GLYCOPYRROLATE PF 0.2 MG/ML IJ SOSY
PREFILLED_SYRINGE | INTRAMUSCULAR | Status: AC
  Filled 2020-06-30: qty 1

## 2020-06-30 MED ORDER — FENTANYL CITRATE (PF) 100 MCG/2ML IJ SOLN: 25.0000 ug | INTRAMUSCULAR | Status: DC | PRN

## 2020-06-30 MED ORDER — OXYCODONE HCL 5 MG PO TABS
ORAL_TABLET | ORAL | Status: AC
  Filled 2020-06-30: qty 1

## 2020-06-30 MED ORDER — GABAPENTIN 300 MG PO CAPS
ORAL_CAPSULE | ORAL | Status: AC
  Filled 2020-06-30: qty 1

## 2020-06-30 MED ORDER — KETOROLAC TROMETHAMINE 30 MG/ML IJ SOLN: 30.0000 mg | Freq: Once | INTRAMUSCULAR | Status: DC | PRN

## 2020-06-30 MED ORDER — EPHEDRINE SULFATE 50 MG/ML IJ SOLN
INTRAMUSCULAR | Status: DC | PRN
  Administered 2020-06-30: 15 mg via INTRAVENOUS
  Administered 2020-06-30 (×2): 10 mg via INTRAVENOUS
  Administered 2020-06-30 (×2): 15 mg via INTRAVENOUS
  Administered 2020-06-30: 10 mg via INTRAVENOUS
  Administered 2020-06-30: 15 mg via INTRAVENOUS

## 2020-06-30 MED ORDER — ACETAMINOPHEN 325 MG PO TABS: 325.0000 mg | ORAL_TABLET | ORAL | Status: DC | PRN

## 2020-06-30 MED ORDER — DEXAMETHASONE SODIUM PHOSPHATE 10 MG/ML IJ SOLN
INTRAMUSCULAR | Status: DC | PRN
  Administered 2020-06-30: 5 mg via INTRAVENOUS

## 2020-06-30 MED ORDER — FENTANYL CITRATE (PF) 100 MCG/2ML IJ SOLN
INTRAMUSCULAR | Status: AC
  Filled 2020-06-30: qty 2

## 2020-06-30 MED ORDER — CLINDAMYCIN PHOSPHATE 900 MG/50ML IV SOLN
900.0000 mg | INTRAVENOUS | Status: AC
  Administered 2020-06-30: 900 mg via INTRAVENOUS

## 2020-06-30 MED ORDER — CHLORHEXIDINE GLUCONATE CLOTH 2 % EX PADS: 6.0000 | MEDICATED_PAD | Freq: Once | CUTANEOUS | Status: DC

## 2020-06-30 MED ORDER — PROPOFOL 10 MG/ML IV BOLUS
INTRAVENOUS | Status: AC
  Filled 2020-06-30: qty 20

## 2020-06-30 MED ORDER — EPHEDRINE 5 MG/ML INJ
INTRAVENOUS | Status: AC
  Filled 2020-06-30: qty 10

## 2020-06-30 MED ORDER — OXYCODONE HCL 5 MG/5ML PO SOLN: 5.0000 mg | Freq: Once | ORAL | Status: AC | PRN

## 2020-06-30 MED ORDER — GABAPENTIN 300 MG PO CAPS
300.0000 mg | ORAL_CAPSULE | ORAL | Status: AC
  Administered 2020-06-30: 300 mg via ORAL

## 2020-06-30 MED ORDER — PROPOFOL 10 MG/ML IV BOLUS
INTRAVENOUS | Status: DC | PRN
  Administered 2020-06-30: 160 mg via INTRAVENOUS

## 2020-06-30 MED ORDER — ACETAMINOPHEN 160 MG/5ML PO SOLN: 325.0000 mg | ORAL | Status: DC | PRN

## 2020-06-30 MED ORDER — IBUPROFEN 800 MG PO TABS: 800.0000 mg | ORAL_TABLET | Freq: Three times a day (TID) | ORAL | 0 refills | Status: DC | PRN

## 2020-06-30 MED ORDER — OXYCODONE HCL 5 MG PO TABS: 5.0000 mg | ORAL_TABLET | Freq: Four times a day (QID) | ORAL | 0 refills | Status: DC | PRN

## 2020-06-30 MED ORDER — CLONIDINE HCL (ANALGESIA) 100 MCG/ML EP SOLN
EPIDURAL | Status: DC | PRN
  Administered 2020-06-30 (×2): 100 ug

## 2020-06-30 MED ORDER — ONDANSETRON HCL 4 MG/2ML IJ SOLN
INTRAMUSCULAR | Status: DC | PRN
  Administered 2020-06-30: 4 mg via INTRAVENOUS

## 2020-06-30 MED ORDER — MIDAZOLAM HCL 2 MG/2ML IJ SOLN
INTRAMUSCULAR | Status: AC
  Filled 2020-06-30: qty 2

## 2020-06-30 MED ORDER — GLYCOPYRROLATE 0.2 MG/ML IJ SOLN
INTRAMUSCULAR | Status: DC | PRN
  Administered 2020-06-30: .2 mg via INTRAVENOUS

## 2020-06-30 MED ORDER — LIDOCAINE HCL (CARDIAC) PF 100 MG/5ML IV SOSY
PREFILLED_SYRINGE | INTRAVENOUS | Status: DC | PRN
  Administered 2020-06-30: 100 mg via INTRAVENOUS

## 2020-06-30 MED ORDER — FENTANYL CITRATE (PF) 100 MCG/2ML IJ SOLN
100.0000 ug | Freq: Once | INTRAMUSCULAR | Status: AC
  Administered 2020-06-30: 100 ug via INTRAVENOUS

## 2020-06-30 MED ORDER — ACETAMINOPHEN 500 MG PO TABS
1000.0000 mg | ORAL_TABLET | ORAL | Status: AC
  Administered 2020-06-30: 1000 mg via ORAL

## 2020-06-30 MED ORDER — PHENYLEPHRINE HCL-NACL 10-0.9 MG/250ML-% IV SOLN
INTRAVENOUS | Status: DC | PRN
  Administered 2020-06-30: 25 ug/min via INTRAVENOUS

## 2020-06-30 MED ORDER — BUPIVACAINE HCL 0.25 % IJ SOLN
INTRAMUSCULAR | Status: DC | PRN
  Administered 2020-06-30: 20 mL

## 2020-06-30 MED ORDER — CLINDAMYCIN PHOSPHATE 900 MG/50ML IV SOLN
INTRAVENOUS | Status: AC
  Filled 2020-06-30: qty 50

## 2020-06-30 MED ORDER — ROPIVACAINE HCL 5 MG/ML IJ SOLN
INTRAMUSCULAR | Status: DC | PRN
  Administered 2020-06-30 (×12): 5 mL via PERINEURAL

## 2020-06-30 MED ORDER — LIDOCAINE 2% (20 MG/ML) 5 ML SYRINGE
INTRAMUSCULAR | Status: AC
  Filled 2020-06-30: qty 5

## 2020-06-30 MED ORDER — PHENYLEPHRINE HCL (PRESSORS) 10 MG/ML IV SOLN
INTRAVENOUS | Status: DC | PRN
  Administered 2020-06-30 (×2): 80 ug via INTRAVENOUS
  Administered 2020-06-30 (×2): 40 ug via INTRAVENOUS
  Administered 2020-06-30 (×2): 80 ug via INTRAVENOUS

## 2020-06-30 MED ORDER — DEXAMETHASONE SODIUM PHOSPHATE 10 MG/ML IJ SOLN
INTRAMUSCULAR | Status: AC
  Filled 2020-06-30: qty 1

## 2020-06-30 MED ORDER — TECHNETIUM TC 99M TILMANOCEPT KIT
1.0000 | PACK | Freq: Once | INTRAVENOUS | Status: AC | PRN
  Administered 2020-06-30: 1 via INTRADERMAL

## 2020-06-30 MED ORDER — FENTANYL CITRATE (PF) 100 MCG/2ML IJ SOLN
INTRAMUSCULAR | Status: DC | PRN
  Administered 2020-06-30: 25 ug via INTRAVENOUS

## 2020-06-30 MED ORDER — MIDAZOLAM HCL 2 MG/2ML IJ SOLN
2.0000 mg | Freq: Once | INTRAMUSCULAR | Status: AC
  Administered 2020-06-30: 2 mg via INTRAVENOUS

## 2020-06-30 SURGICAL SUPPLY — 48 items
ADH SKN CLS APL DERMABOND .7 (GAUZE/BANDAGES/DRESSINGS) ×2
APL PRP STRL LF DISP 70% ISPRP (MISCELLANEOUS) ×2
APPLIER CLIP 9.375 MED OPEN (MISCELLANEOUS)
APR CLP MED 9.3 20 MLT OPN (MISCELLANEOUS)
BINDER BREAST LRG (GAUZE/BANDAGES/DRESSINGS) IMPLANT
BINDER BREAST XLRG (GAUZE/BANDAGES/DRESSINGS) IMPLANT
BLADE SURG 15 STRL LF DISP TIS (BLADE) ×2 IMPLANT
BLADE SURG 15 STRL SS (BLADE) ×3
CANISTER SUCT 1200ML W/VALVE (MISCELLANEOUS) ×3 IMPLANT
CHLORAPREP W/TINT 26 (MISCELLANEOUS) ×3 IMPLANT
CLIP APPLIE 9.375 MED OPEN (MISCELLANEOUS) IMPLANT
COVER BACK TABLE 60X90IN (DRAPES) ×3 IMPLANT
COVER MAYO STAND STRL (DRAPES) ×3 IMPLANT
COVER PROBE W GEL 5X96 (DRAPES) ×3 IMPLANT
DECANTER SPIKE VIAL GLASS SM (MISCELLANEOUS) IMPLANT
DERMABOND ADVANCED (GAUZE/BANDAGES/DRESSINGS) ×1
DERMABOND ADVANCED .7 DNX12 (GAUZE/BANDAGES/DRESSINGS) ×2 IMPLANT
DRAPE LAPAROSCOPIC ABDOMINAL (DRAPES) ×3 IMPLANT
DRAPE UTILITY XL STRL (DRAPES) ×3 IMPLANT
ELECT COATED BLADE 2.86 ST (ELECTRODE) ×3 IMPLANT
ELECT REM PT RETURN 9FT ADLT (ELECTROSURGICAL) ×3
ELECTRODE REM PT RTRN 9FT ADLT (ELECTROSURGICAL) ×2 IMPLANT
GLOVE BIO SURGEON STRL SZ 6.5 (GLOVE) ×3 IMPLANT
GLOVE BIOGEL PI IND STRL 8 (GLOVE) ×2 IMPLANT
GLOVE BIOGEL PI INDICATOR 8 (GLOVE) ×1
GLOVE ECLIPSE 8.0 STRL XLNG CF (GLOVE) ×3 IMPLANT
GOWN STRL REUS W/ TWL LRG LVL3 (GOWN DISPOSABLE) ×4 IMPLANT
GOWN STRL REUS W/TWL LRG LVL3 (GOWN DISPOSABLE) ×6
HEMOSTAT ARISTA ABSORB 3G PWDR (HEMOSTASIS) ×3 IMPLANT
HEMOSTAT SURGICEL 2X14 (HEMOSTASIS) IMPLANT
KIT MARKER MARGIN INK (KITS) ×3 IMPLANT
NDL SAFETY ECLIPSE 18X1.5 (NEEDLE) IMPLANT
NEEDLE HYPO 18GX1.5 SHARP (NEEDLE)
NEEDLE HYPO 25X1 1.5 SAFETY (NEEDLE) ×3 IMPLANT
NS IRRIG 1000ML POUR BTL (IV SOLUTION) ×3 IMPLANT
PACK BASIN DAY SURGERY FS (CUSTOM PROCEDURE TRAY) ×3 IMPLANT
PENCIL SMOKE EVACUATOR (MISCELLANEOUS) ×3 IMPLANT
SLEEVE SCD COMPRESS KNEE MED (MISCELLANEOUS) ×3 IMPLANT
SPONGE LAP 4X18 RFD (DISPOSABLE) ×3 IMPLANT
SUT MNCRL AB 4-0 PS2 18 (SUTURE) ×6 IMPLANT
SUT SILK 2 0 SH (SUTURE) IMPLANT
SUT VICRYL 3-0 CR8 SH (SUTURE) ×6 IMPLANT
SYR BULB EAR ULCER 3OZ GRN STR (SYRINGE) IMPLANT
SYR CONTROL 10ML LL (SYRINGE) ×3 IMPLANT
TOWEL GREEN STERILE FF (TOWEL DISPOSABLE) ×3 IMPLANT
TRAY FAXITRON CT DISP (TRAY / TRAY PROCEDURE) ×3 IMPLANT
TUBE CONNECTING 20X1/4 (TUBING) ×3 IMPLANT
YANKAUER SUCT BULB TIP NO VENT (SUCTIONS) ×3 IMPLANT

## 2020-06-30 NOTE — Anesthesia Procedure Notes (Signed)
Procedure Name: LMA Insertion Date/Time: 06/30/2020 2:10 PM Performed by: Lavonia Dana, CRNA Pre-anesthesia Checklist: Patient identified, Emergency Drugs available, Suction available and Patient being monitored Patient Re-evaluated:Patient Re-evaluated prior to induction Oxygen Delivery Method: Circle system utilized Preoxygenation: Pre-oxygenation with 100% oxygen Induction Type: IV induction Ventilation: Mask ventilation without difficulty LMA: LMA inserted LMA Size: 4.0 Number of attempts: 1 Airway Equipment and Method: Bite block Placement Confirmation: positive ETCO2 Tube secured with: Tape Dental Injury: Teeth and Oropharynx as per pre-operative assessment

## 2020-06-30 NOTE — H&P (Signed)
Shannon Griffith is an 64 y.o. female.   Chief Complaint: Bilateral breast cancer HPI: Patient returns for reexcision left breast lumpectomy and right sentinel lymph node mapping for bilateral breast cancers.  Past Medical History:  Diagnosis Date  . Anxiety   . Arthritis    fingers  . Cancer ([place]) 05/2020   left breast IDC  . Depression   . Family history of breast cancer   . Family history of lung cancer   . Family history of melanoma   . Family history of throat cancer   . Hyperlipidemia     Past Surgical History:  Procedure Laterality Date  . ABDOMINAL HYSTERECTOMY    . ANTERIOR AND POSTERIOR REPAIR WITH SACROSPINOUS FIXATION N/A 07/12/2016   Procedure: ANTERIOR AND POSTERIOR REPAIR WITH SACROSPINOUS FIXATION;  Surgeon: Molli Posey, MD;  Location: [facility];  Service: Gynecology;  Laterality: N/A;  . BREAST LUMPECTOMY WITH RADIOACTIVE SEED AND SENTINEL LYMPH NODE BIOPSY Bilateral 06/17/2020   Procedure: BILATERAL BREAST LUMPECTOMY WITH RADIOACTIVE SEED AND LEFT SENTINEL LYMPH NODE MAPPING;  Surgeon: Erroll Luna, MD;  Location: [facility];  Service: General;  Laterality: Bilateral;  . CHOLECYSTECTOMY    . COLONOSCOPY    . CYSTOSCOPY N/A 07/12/2016   Procedure: CYSTOSCOPY;  Surgeon: Molli Posey, MD;  Location: [facility];  Service: Gynecology;  Laterality: N/A;  . DILATION AND CURETTAGE OF UTERUS     twice  . LAPAROSCOPIC VAGINAL HYSTERECTOMY WITH SALPINGO OOPHORECTOMY Bilateral 07/12/2016   Procedure: LAPAROSCOPIC ASSISTED VAGINAL HYSTERECTOMY WITH SALPINGO OOPHORECTOMY;  Surgeon: Molli Posey, MD;  Location: [facility];  Service: Gynecology;  Laterality: Bilateral;    Family History  Problem Relation Age of Onset  . Melanoma Brother 24  . Breast cancer Daughter 10       Stage III lobular carcinoma  . Lung cancer Brother 22  . Throat cancer Paternal Uncle        dx 62s, smoker  . Stroke Paternal Grandmother   . Breast cancer Paternal Grandmother         dx early 40s, b/l mastectomies   Social History:  reports that she has never smoked. She has never used smokeless tobacco. She reports current alcohol use. She reports that she does not use drugs.  Allergies:  Allergies  Allergen Reactions  . Cephalexin Rash  . Nitrofurantoin Monohyd Macro Nausea And Vomiting  . Other Nausea And Vomiting    Nitrofuratoin    Medications Prior to Admission  Medication Sig Dispense Refill  . buPROPion (WELLBUTRIN XL) 300 MG 24 hr tablet Take 300 mg by mouth at bedtime.  3  . Cholecalciferol (VITAMIN D3) 1.25 MG (50000 UT) CAPS Take by mouth.    . ibandronate (BONIVA) 150 MG tablet Take by mouth every 30 (thirty) days.     Marland Kitchen ibuprofen (ADVIL) 800 MG tablet Take 1 tablet (800 mg total) by mouth every 8 (eight) hours as needed. 30 tablet 0  . Multiple Vitamin (MULTIVITAMIN ADULT PO) Take by mouth.    . oxyCODONE (OXY IR/ROXICODONE) 5 MG immediate release tablet Take 1 tablet (5 mg total) by mouth every 6 (six) hours as needed for severe pain. 15 tablet 0  . QUEtiapine Fumarate (SEROQUEL XR) 150 MG 24 hr tablet Take 150 mg by mouth at bedtime.  3  . rosuvastatin (CRESTOR) 10 MG tablet Take 10 mg by mouth at bedtime.    Marland Kitchen albuterol (VENTOLIN HFA) 108 (90 Base) MCG/ACT inhaler Inhale into the lungs.    Marland Kitchen ibuprofen (  ADVIL,MOTRIN) 800 MG tablet Take 1 tablet (800 mg total) by mouth every 8 (eight) hours as needed for moderate pain (mild pain). 30 tablet 1  . montelukast (SINGULAIR) 10 MG tablet Take by mouth.      No results found for this or any previous visit (from the past 48 hour(s)). No results found.  Review of Systems  All other systems reviewed and are negative.   Blood pressure (!) 145/89, pulse 83, temperature 98.9 F (37.2 C), temperature source Oral, resp. rate 18, height 5\' 6"  (1.676 m), weight 75.3 kg, SpO2 98 %. Physical Exam Constitutional:      Appearance: Normal appearance.  Cardiovascular:     Rate and Rhythm: Normal rate and regular  rhythm.  Pulmonary:     Effort: Pulmonary effort is normal.  Chest:    Neurological:     Mental Status: She is alert.      Assessment/Plan Bilateral breast cancer here today for left breast reexcision of a positive margin and right axillary sentinel node mapping.Sentinel lymph node mapping and dissection has been discussed with the patient.  Risk of bleeding,  Infection,  Seroma formation,  Additional procedures,,  Shoulder weakness ,  Shoulder stiffness,  Nerve and blood vessel injury and reaction to the mapping dyes have been discussed.  Alternatives to surgery have been discussed with the patient.  The patient agrees to proceed.The procedure has been discussed with the patient. Alternatives to surgery have been discussed with the patient.  Risks of surgery include bleeding,  Infection,  Seroma formation, death,  and the need for further surgery.   The patient understands and wishes to proceed.  Turner Daniels, MD 06/30/2020, 1:46 PM

## 2020-06-30 NOTE — Anesthesia Postprocedure Evaluation (Signed)
Anesthesia Post Note  Patient: Shannon Griffith  Procedure(s) Performed: RE EXCISION LEFT BREAST LUMPECTOMY (Left Breast) RIGHT AXILLARY SENTINEL LYMPH NODE BIOPSY (Right Axilla)     Patient location during evaluation: Phase II Anesthesia Type: General Level of consciousness: awake Pain management: pain level controlled Vital Signs Assessment: post-procedure vital signs reviewed and stable Respiratory status: spontaneous breathing Cardiovascular status: stable Postop Assessment: no apparent nausea or vomiting Anesthetic complications: no   No complications documented.  Last Vitals:  Vitals:   06/30/20 1545 06/30/20 1600  BP: 121/66 123/70  Pulse: 74 77  Resp: 12 12  Temp:    SpO2: 100% 96%    Last Pain:  Vitals:   06/30/20 1600  TempSrc:   PainSc: 0-No pain                 John F Salome Arnt

## 2020-06-30 NOTE — Discharge Instructions (Signed)
Next dose of Tylenol can be given at 6:30pm if needed. Next dose of NSAID (Ibuprofen/Motrin/Aleve) can be given at 8:30pm if needed.   Post Anesthesia Home Care Instructions  Activity: Get plenty of rest for the remainder of the day. A responsible individual must stay with you for 24 hours following the procedure.  For the next 24 hours, DO NOT: -Drive a car -Paediatric nurse -Drink alcoholic beverages -Take any medication unless instructed by your physician -Make any legal decisions or sign important papers.  Meals: Start with liquid foods such as gelatin or soup. Progress to regular foods as tolerated. Avoid greasy, spicy, heavy foods. If nausea and/or vomiting occur, drink only clear liquids until the nausea and/or vomiting subsides. Call your physician if vomiting continues.  Special Instructions/Symptoms: Your throat may feel dry or sore from the anesthesia or the breathing tube placed in your throat during surgery. If this causes discomfort, gargle with warm salt water. The discomfort should disappear within 24 hours.  If you had a scopolamine patch placed behind your ear for the management of post- operative nausea and/or vomiting:  1. The medication in the patch is effective for 72 hours, after which it should be removed.  Wrap patch in a tissue and discard in the trash. Wash hands thoroughly with soap and water. 2. You may remove the patch earlier than 72 hours if you experience unpleasant side effects which may include dry mouth, dizziness or visual disturbances. 3. Avoid touching the patch. Wash your hands with soap and water after contact with the patch.         [place] Office Phone Number [phone]  BREAST BIOPSY/ PARTIAL MASTECTOMY: POST OP INSTRUCTIONS  Always review your discharge instruction sheet given to you by the facility where your surgery was performed.  IF YOU HAVE DISABILITY OR FAMILY LEAVE FORMS, YOU MUST BRING THEM TO THE  OFFICE FOR PROCESSING.  DO NOT GIVE THEM TO YOUR DOCTOR.  1. A prescription for pain medication may be given to you upon discharge.  Take your pain medication as prescribed, if needed.  If narcotic pain medicine is not needed, then you may take acetaminophen (Tylenol) or ibuprofen (Advil) as needed. 2. Take your usually prescribed medications unless otherwise directed 3. If you need a refill on your pain medication, please contact your pharmacy.  They will contact our office to request authorization.  Prescriptions will not be filled after 5pm or on week-ends. 4. You should eat very light the first 24 hours after surgery, such as soup, crackers, pudding, etc.  Resume your normal diet the day after surgery. 5. Most patients will experience some swelling and bruising in the breast.  Ice packs and a good support bra will help.  Swelling and bruising can take several days to resolve.  6. It is common to experience some constipation if taking pain medication after surgery.  Increasing fluid intake and taking a stool softener will usually help or prevent this problem from occurring.  A mild laxative (Milk of Magnesia or Miralax) should be taken according to package directions if there are no bowel movements after 48 hours. 7. Unless discharge instructions indicate otherwise, you may remove your bandages 24-48 hours after surgery, and you may shower at that time.  You may have steri-strips (small skin tapes) in place directly over the incision.  These strips should be left on the skin for 7-10 days.  If your surgeon used skin glue on the incision, you may shower in 24  hours.  The glue will flake off over the next 2-3 weeks.  Any sutures or staples will be removed at the office during your follow-up visit. 8. ACTIVITIES:  You may resume regular daily activities (gradually increasing) beginning the next day.  Wearing a good support bra or sports bra minimizes pain and swelling.  You may have sexual intercourse when  it is comfortable. a. You may drive when you no longer are taking prescription pain medication, you can comfortably wear a seatbelt, and you can safely maneuver your car and apply brakes. b. RETURN TO WORK:  ______________________________________________________________________________________ 9. You should see your doctor in the office for a follow-up appointment approximately two weeks after your surgery.  Your doctor's nurse will typically make your follow-up appointment when she calls you with your pathology report.  Expect your pathology report 2-3 business days after your surgery.  You may call to check if you do not hear from [place] after three days. 10. OTHER INSTRUCTIONS: _______________________________________________________________________________________________ _____________________________________________________________________________________________________________________________________ _____________________________________________________________________________________________________________________________________ _____________________________________________________________________________________________________________________________________  WHEN TO CALL YOUR DOCTOR: 1. Fever over 101.0 2. Nausea and/or vomiting. 3. Extreme swelling or bruising. 4. Continued bleeding from incision. 5. Increased pain, redness, or drainage from the incision.  The clinic staff is available to answer your questions during regular business hours.  Please don't hesitate to call and ask to speak to one of the nurses for clinical concerns.  If you have a medical emergency, go to the nearest emergency room or call 911.  A surgeon from [facility] is always on call at the hospital.  For further questions, please visit [email or web address]  Sentinel lymph node mapping and dissection has been discussed with the patient.  Risk of bleeding,  Infection,  Seroma formation,  Additional  procedures,,  Shoulder weakness ,  Shoulder stiffness,  Nerve and blood vessel injury and reaction to the mapping dyes have been discussed.  Alternatives to surgery have been discussed with the patient.  The patient agrees to proceed.

## 2020-06-30 NOTE — Anesthesia Preprocedure Evaluation (Signed)
Anesthesia Evaluation  Patient identified by MRN, date of birth, ID band Patient awake    Reviewed: Allergy & Precautions, NPO status , Patient's Chart, lab work & pertinent test results  Airway Mallampati: I       Dental no notable dental hx.    Pulmonary neg pulmonary ROS,    Pulmonary exam normal        Cardiovascular negative cardio ROS Normal cardiovascular exam     Neuro/Psych PSYCHIATRIC DISORDERS Anxiety Depression negative neurological ROS     GI/Hepatic negative GI ROS, Neg liver ROS,   Endo/Other  negative endocrine ROS  Renal/GU negative Renal ROS  negative genitourinary   Musculoskeletal  (+) Fibromyalgia -  Abdominal Normal abdominal exam  (+)   Peds  Hematology negative hematology ROS (+)   Anesthesia Other Findings   Reproductive/Obstetrics                             Anesthesia Physical Anesthesia Plan  ASA: II  Anesthesia Plan: General   Post-op Pain Management:  Regional for Post-op pain   Induction: Intravenous  PONV Risk Score and Plan: 3 and Ondansetron, Dexamethasone and Midazolam  Airway Management Planned: LMA  Additional Equipment: None  Intra-op Plan:   Post-operative Plan:   Informed Consent: I have reviewed the patients History and Physical, chart, labs and discussed the procedure including the risks, benefits and alternatives for the proposed anesthesia with the patient or authorized representative who has indicated his/her understanding and acceptance.     Dental advisory given  Plan Discussed with: CRNA  Anesthesia Plan Comments:         Anesthesia Quick Evaluation

## 2020-06-30 NOTE — Anesthesia Procedure Notes (Addendum)
Anesthesia Regional Block: Pectoralis block   Pre-Anesthetic Checklist: ,, timeout performed, Correct Patient, Correct Site, Correct Laterality, Correct Procedure, Correct Position, site marked, Risks and benefits discussed,  Surgical consent,  Pre-op evaluation,  At surgeon's request and post-op pain management  Laterality: N/A and Left  Prep: chloraprep       Needles:  Injection technique: Single-shot  Needle Type: Echogenic Stimulator Needle     Needle Length: 9cm  Needle Gauge: 20   Needle insertion depth: 3 cm   Additional Needles:   Procedures:,,,, ultrasound used (permanent image in chart),,,,  Narrative:  Start time: 06/30/2020 1:25 PM End time: 06/30/2020 1:35 PM Injection made incrementally with aspirations every 5 mL.  Performed by: Personally  Anesthesiologist: Lyn Hollingshead, MD

## 2020-06-30 NOTE — Transfer of Care (Signed)
Immediate Anesthesia Transfer of Care Note  Patient: Shannon Griffith  Procedure(s) Performed: RE EXCISION LEFT BREAST LUMPECTOMY (Left Breast) RIGHT AXILLARY SENTINEL LYMPH NODE BIOPSY (Right Axilla)  Patient Location: PACU  Anesthesia Type:GA combined with regional for post-op pain  Level of Consciousness: drowsy  Airway & Oxygen Therapy: Patient Spontanous Breathing and Patient connected to face mask oxygen  Post-op Assessment: Report given to RN and Post -op Vital signs reviewed and stable  Post vital signs: Reviewed and stable  Last Vitals:  Vitals Value Taken Time  BP    Temp 36.4 C 06/30/20 1525  Pulse 81 06/30/20 1526  Resp 15 06/30/20 1526  SpO2 99 % 06/30/20 1526  Vitals shown include unvalidated device data.  Last Pain:  Vitals:   06/30/20 1226  TempSrc: Oral  PainSc: 0-No pain         Complications: No complications documented.

## 2020-06-30 NOTE — Anesthesia Procedure Notes (Addendum)
Anesthesia Regional Block: Pectoralis block   Pre-Anesthetic Checklist: ,, timeout performed, Correct Patient, Correct Site, Correct Laterality, Correct Procedure, Correct Position, site marked, Risks and benefits discussed,  Surgical consent,  Pre-op evaluation,  At surgeon's request and post-op pain management  Laterality: Right and N/A  Prep: chloraprep       Needles:  Injection technique: Single-shot  Needle Type: Echogenic Stimulator Needle     Needle Length: 9cm  Needle Gauge: 20   Needle insertion depth: 3 cm   Additional Needles:   Procedures:,,,, ultrasound used (permanent image in chart),,,,  Narrative:  Start time: 06/30/2020 1:40 PM End time: 06/30/2020 1:50 PM Injection made incrementally with aspirations every 5 mL.  Performed by: Personally  Anesthesiologist: Lyn Hollingshead, MD

## 2020-06-30 NOTE — Interval H&P Note (Signed)
History and Physical Interval Note:  06/30/2020 1:50 PM  Shannon Griffith  has presented today for surgery, with the diagnosis of BILATERAL BREAST CANCER.  The various methods of treatment have been discussed with the patient and family. After consideration of risks, benefits and other options for treatment, the patient has consented to  Procedure(s): RE EXCISION LEFT BREAST LUMPECTOMY WITH RIGHT BREAST SENTINEL LYMPH NODE MAPPING (Left) as a surgical intervention.  The patient's history has been reviewed, patient examined, no change in status, stable for surgery.  I have reviewed the patient's chart and labs.  Questions were answered to the patient's satisfaction.     Turner Daniels MD

## 2020-06-30 NOTE — Progress Notes (Signed)
Assisted Dr. Jillyn Hidden with right, left, ultrasound guided, pectoralis block. Side rails up, monitors on throughout procedure. See vital signs in flow sheet. Tolerated Procedure well.

## 2020-06-30 NOTE — Op Note (Signed)
Preoperative diagnosis: Stage I left breast cancer number next stage I right breast cancer  Postop diagnosis same  Procedure: Reexcision left breast lumpectomy with right axillary sentinel lymph node mapping  Surgeon: Erroll Luna, MD  Esthesia: General with pectoral block local anesthetic of 0.25% Marcaine plain  EBL: 30 cc  Specimen: Left breast lumpectomy cavity reexcision of all margins  Right axillary sentinel node x1  Drains: None  Indications for procedure: The patient presents for reexcision left breast lumpectomy after going left breast lumpectomy with with symptom mapping and a right breast lumpectomy due to discordant finding on core biopsy.  The left breast showed positive margin anterior and lateral.  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  The right breast showed a stage I grade 1 lobular carcinoma in the upper outer quadrant.  She returns for mapping on the right reexcision left.The procedure has been discussed with the patient. Alternatives to surgery have been discussed with the patient.  Risks of surgery include bleeding,  Infection,  Seroma formation, death,  and the need for further surgery.   The patient understands and wishes to proceed.Sentinel lymph node mapping and dissection has been discussed with the patient.  Risk of bleeding,  Infection,  Seroma formation,  Additional procedures,,  Shoulder weakness ,  Shoulder stiffness,  Nerve and blood vessel injury and reaction to the mapping dyes have been discussed.  Alternatives to surgery have been discussed with the patient.  The patient agrees to proceed.  Description of procedure: The patient was met holding questions were answered.  Both breasts were marked as correct side.  She underwent injection of technetium sulfur colloid on the right.  She was then taken back to operating.  She is placed supine upon the OR table.  Both breasts were prepped and draped sterile fashion after induction of general anesthesia.  Left  side was done first.  Lumpectomy cavity was opened after infiltration with local anesthetic.  The cerumen was evacuated.  All margins were excised.  These were oriented with ink and then sent to pathology in separate containers.  Cavities made hemostatic with cautery and closed with 3-0 Vicryl and 4-0 Monocryl.   Neoprobe used hotspot identified in the right axilla.  4 cm incision made in the right axilla.  Dissection carried down the level 1 contents.  There was 1 hot nodes removed.  Background counts approaches 0.  Hemostasis achieved with cautery and Arista.  Wound closed with 3-0 Vicryl for Monocryl.  Dermabond applied.  Breast binder placed.  All counts found to be correct.  The patient was awoke extubated taken to recovery in satisfactory condition.

## 2020-07-01 ENCOUNTER — Encounter (HOSPITAL_BASED_OUTPATIENT_CLINIC_OR_DEPARTMENT_OTHER): Payer: Self-pay | Admitting: Surgery

## 2020-07-01 NOTE — Addendum Note (Signed)
Addendum  created 07/01/20 1029 by Glory Buff, CRNA   Charge Capture section accepted

## 2020-07-03 LAB — SURGICAL PATHOLOGY

## 2020-07-06 ENCOUNTER — Telehealth: Payer: Self-pay | Admitting: *Deleted

## 2020-07-06 ENCOUNTER — Encounter: Payer: Self-pay | Admitting: *Deleted

## 2020-07-06 DIAGNOSIS — C50412 Malignant neoplasm of upper-outer quadrant of left female breast: Secondary | ICD-10-CM

## 2020-07-06 NOTE — Telephone Encounter (Signed)
Received oncotype score of 13. Physician team notified. Called pt with results. Left vm with no chemo recommendations and next step of xrt with Dr. Lisbeth Renshaw. Contact information provided for questions or needs.

## 2020-07-10 ENCOUNTER — Encounter: Payer: Self-pay | Admitting: Hematology and Oncology

## 2020-07-13 ENCOUNTER — Encounter: Payer: Self-pay | Admitting: *Deleted

## 2020-07-14 ENCOUNTER — Encounter: Payer: Self-pay | Admitting: *Deleted

## 2020-07-23 ENCOUNTER — Encounter: Payer: Self-pay | Admitting: Radiation Oncology

## 2020-07-23 ENCOUNTER — Ambulatory Visit
Admission: RE | Admit: 2020-07-23 | Discharge: 2020-07-23 | Disposition: A | Discharge status: Home / Self Care | Payer: Medicare HMO | Source: Ambulatory Visit | Attending: Radiation Oncology | Admitting: Radiation Oncology

## 2020-07-23 ENCOUNTER — Other Ambulatory Visit: Payer: Self-pay

## 2020-07-23 VITALS — BP 140/90 | HR 83 | Resp 18 | Wt 164.4 lb

## 2020-07-23 DIAGNOSIS — C50412 Malignant neoplasm of upper-outer quadrant of left female breast: Secondary | ICD-10-CM

## 2020-07-23 DIAGNOSIS — Z79899 Other long term (current) drug therapy: Secondary | ICD-10-CM | POA: Diagnosis not present

## 2020-07-23 DIAGNOSIS — Z803 Family history of malignant neoplasm of breast: Secondary | ICD-10-CM | POA: Insufficient documentation

## 2020-07-23 DIAGNOSIS — C50011 Malignant neoplasm of nipple and areola, right female breast: Secondary | ICD-10-CM | POA: Insufficient documentation

## 2020-07-23 DIAGNOSIS — F418 Other specified anxiety disorders: Secondary | ICD-10-CM | POA: Insufficient documentation

## 2020-07-23 DIAGNOSIS — Z170 Estrogen receptor positive status [ER+]: Secondary | ICD-10-CM | POA: Insufficient documentation

## 2020-07-23 DIAGNOSIS — Z801 Family history of malignant neoplasm of trachea, bronchus and lung: Secondary | ICD-10-CM | POA: Insufficient documentation

## 2020-07-23 DIAGNOSIS — E785 Hyperlipidemia, unspecified: Secondary | ICD-10-CM | POA: Insufficient documentation

## 2020-07-23 NOTE — Progress Notes (Signed)
Radiation Oncology         ([phone]) [phone] ________________________________  Name: Shannon Griffith        MRN: [id number]  Date of Service: 07/23/2020 DOB: 31-Oct-1955  LX:[id number], Rebeca Alert, MD  Nicholas Lose, MD     REFERRING PHYSICIAN: Nicholas Lose, MD   DIAGNOSIS: The encounter diagnosis was Malignant neoplasm of upper-outer quadrant of left breast in female, estrogen receptor positive ([place]).   HISTORY OF PRESENT ILLNESS: Shannon Griffith is a 64 y.o. female seen  for a new diagnosis of left breast cancer. The patient was noted to have screening detected distortion in bilateral breasts.  Further diagnostic imaging revealed an 8 mm mass in the right breast within the upper outer quadrant at 9:30 position, her right axilla was negative for adenopathy.  Her left breast revealed a mass measuring 7 mm in the 1 o'clock position that was also negative for axillary lymphadenopathy.  Biopsies on 05/20/2020 revealed columnar hyperplasia within the right breast mass, negative for malignancy.  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  She has undergone lumpectomies sentinel node biopsy on 06/17/2020 with Dr. Brantley Stage.  Findings in the right breast revealed a grade 2 invasive lobular carcinoma, resection margins were negative but the closest margins were less than 1 mm in the superior and inferior locations in 2 mm to the posterior margin. Her cancer has not had prognostic panels performed on the right yet. Her cancer was her left breast revealed a grade 2 invasive ductal carcinoma measuring 8 mm with associated high-grade DCIS, this carcinoma involves the superior margin with DCIS less than 1 mm to the lateral margin, and 2 sampled lymph nodes were negative for disease in the left axilla.  Given the new finding in the right breast, she underwent right sentinel lymph node biopsy and additional reexcision for  margins of the left breast.  The right sentinel lymph node was negative for metastatic disease, her reexcision margins showed no residual carcinoma, focal atypical lobular hyperplasia was identified in the left breast.  She is seen today to discuss moving forward with adjuvant radiotherapy to both breasts.  Oncotype scoring from the left breast was also performed and showed a score of 13 so she will not receive any systemic chemotherapy.   PREVIOUS RADIATION THERAPY: No   PAST MEDICAL HISTORY:  Past Medical History:  Diagnosis Date  . Anxiety   . Arthritis    fingers  . Cancer ([place]) 05/2020   left breast IDC  . Depression   . Family history of breast cancer   . Family history of lung cancer   . Family history of melanoma   . Family history of throat cancer   . Hyperlipidemia        PAST SURGICAL HISTORY: Past Surgical History:  Procedure Laterality Date  . ABDOMINAL HYSTERECTOMY    . ANTERIOR AND POSTERIOR REPAIR WITH SACROSPINOUS FIXATION N/A 07/12/2016   Procedure: ANTERIOR AND POSTERIOR REPAIR WITH SACROSPINOUS FIXATION;  Surgeon: Molli Posey, MD;  Location: [facility];  Service: Gynecology;  Laterality: N/A;  . AXILLARY LYMPH NODE BIOPSY Right 06/30/2020   Procedure: RIGHT AXILLARY SENTINEL LYMPH NODE BIOPSY;  Surgeon: Erroll Luna, MD;  Location: [facility];  Service: General;  Laterality: Right;  . BREAST LUMPECTOMY WITH RADIOACTIVE SEED AND SENTINEL LYMPH NODE BIOPSY Bilateral 06/17/2020   Procedure: BILATERAL BREAST LUMPECTOMY WITH RADIOACTIVE SEED AND LEFT  SENTINEL LYMPH NODE MAPPING;  Surgeon: Erroll Luna, MD;  Location: [facility];  Service: General;  Laterality: Bilateral;  . CHOLECYSTECTOMY    . COLONOSCOPY    . CYSTOSCOPY N/A 07/12/2016   Procedure: CYSTOSCOPY;  Surgeon: Molli Posey, MD;  Location: [facility];  Service: Gynecology;  Laterality: N/A;  . DILATION AND CURETTAGE OF UTERUS     twice  . LAPAROSCOPIC VAGINAL  HYSTERECTOMY WITH SALPINGO OOPHORECTOMY Bilateral 07/12/2016   Procedure: LAPAROSCOPIC ASSISTED VAGINAL HYSTERECTOMY WITH SALPINGO OOPHORECTOMY;  Surgeon: Molli Posey, MD;  Location: [facility];  Service: Gynecology;  Laterality: Bilateral;  . RE-EXCISION OF BREAST LUMPECTOMY Left 06/30/2020   Procedure: RE EXCISION LEFT BREAST LUMPECTOMY;  Surgeon: Erroll Luna, MD;  Location: [facility];  Service: General;  Laterality: Left;     FAMILY HISTORY:  Family History  Problem Relation Age of Onset  . Melanoma Brother 44  . Breast cancer Daughter 15       Stage III lobular carcinoma  . Lung cancer Brother 46  . Throat cancer Paternal Uncle        dx 86s, smoker  . Stroke Paternal Grandmother   . Breast cancer Paternal Grandmother        dx early 68s, b/l mastectomies     SOCIAL HISTORY:  reports that she has never smoked. She has never used smokeless tobacco. She reports current alcohol use. She reports that she does not use drugs.  The patient is married and lives in [place]. She relocated to [facility] about 6 years ago from [place] to help care for her grandchildren. She also moved her 36 year old parents to [facility] in the last year as well to help care for them too.   ALLERGIES: Cephalexin, Nitrofurantoin monohyd macro, and Other   MEDICATIONS:  Current Outpatient Medications  Medication Sig Dispense Refill  . albuterol (VENTOLIN HFA) 108 (90 Base) MCG/ACT inhaler Inhale into the lungs.    Marland Kitchen buPROPion (WELLBUTRIN XL) 300 MG 24 hr tablet Take 300 mg by mouth at bedtime.  3  . Cholecalciferol (VITAMIN D3) 1.25 MG (50000 UT) CAPS Take by mouth.    . ibandronate (BONIVA) 150 MG tablet Take by mouth every 30 (thirty) days.     Marland Kitchen ibuprofen (ADVIL) 800 MG tablet Take 1 tablet (800 mg total) by mouth every 8 (eight) hours as needed. 30 tablet 0  . ibuprofen (ADVIL) 800 MG tablet Take 1 tablet (800 mg total) by mouth every 8 (eight) hours as needed. 30 tablet 0  . ibuprofen  (ADVIL,MOTRIN) 800 MG tablet Take 1 tablet (800 mg total) by mouth every 8 (eight) hours as needed for moderate pain (mild pain). 30 tablet 1  . montelukast (SINGULAIR) 10 MG tablet Take by mouth.    . Multiple Vitamin (MULTIVITAMIN ADULT PO) Take by mouth.    . oxyCODONE (OXY IR/ROXICODONE) 5 MG immediate release tablet Take 1 tablet (5 mg total) by mouth every 6 (six) hours as needed for severe pain. 15 tablet 0  . oxyCODONE (OXY IR/ROXICODONE) 5 MG immediate release tablet Take 1 tablet (5 mg total) by mouth every 6 (six) hours as needed for severe pain. 15 tablet 0  . QUEtiapine Fumarate (SEROQUEL XR) 150 MG 24 hr tablet Take 150 mg by mouth at bedtime.  3  . rosuvastatin (CRESTOR) 10 MG tablet Take 10 mg by mouth at bedtime.     No current facility-administered medications for this encounter.     REVIEW OF SYSTEMS: On  review of systems, the patient reports that she is doing well overall without concerns for her incision sites or healing.  PHYSICAL EXAM:  Wt Readings from Last 3 Encounters:  06/30/20 166 lb 0.1 oz (75.3 kg)  06/24/20 164 lb 11.2 oz (74.7 kg)  06/17/20 166 lb 3.6 oz (75.4 kg)   Temp Readings from Last 3 Encounters:  06/30/20 97.8 F (36.6 C)  06/24/20 98 F (36.7 C) (Tympanic)  06/17/20 97.7 F (36.5 C)   BP Readings from Last 3 Encounters:  06/30/20 135/69  06/24/20 (!) 141/92  06/17/20 (!) 155/83   Pulse Readings from Last 3 Encounters:  06/30/20 73  06/24/20 87  06/17/20 75    In general this is a well appearing Caucasian female in no acute distress. She's alert and oriented x4 and appropriate throughout the examination. Cardiopulmonary assessment is negative for acute distress and she exhibits normal effort. Breast exam is deferred.    ECOG = 0  0 - Asymptomatic (Fully active, able to carry on all predisease activities without restriction)  1 - Symptomatic but completely ambulatory (Restricted in physically strenuous activity but ambulatory and  able to carry out work of a light or sedentary nature. For example, light housework, office work)  2 - Symptomatic, <50% in bed during the day (Ambulatory and capable of all self care but unable to carry out any work activities. Up and about more than 50% of waking hours)  3 - Symptomatic, >50% in bed, but not bedbound (Capable of only limited self-care, confined to bed or chair 50% or more of waking hours)  4 - Bedbound (Completely disabled. Cannot carry on any self-care. Totally confined to bed or chair)  5 - Death   Eustace Pen MM, Creech RH, Tormey DC, et al. [id number]). "Toxicity and response criteria of the [facility]". [place] Oncol. 5 (6): 649-55    LABORATORY DATA:  Lab Results  Component Value Date   WBC 4.9 06/16/2020   HGB 12.9 06/16/2020   HCT 40.3 06/16/2020   MCV 92.4 06/16/2020   PLT 264 06/16/2020   Lab Results  Component Value Date   NA 141 06/16/2020   K 4.7 06/16/2020   CL 104 06/16/2020   CO2 29 06/16/2020   Lab Results  Component Value Date   ALT 25 06/16/2020   AST 24 06/16/2020   ALKPHOS 81 06/16/2020   BILITOT 0.5 06/16/2020      RADIOGRAPHY: NM Sentinel Node Inj-No Rpt (Breast)  Result Date: 06/30/2020 Sulfur colloid was injected by the nuclear medicine technologist for melanoma sentinel node.       IMPRESSION/PLAN: 1. 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 Dr. Lisbeth Renshaw discusses the pathology findings and reviews the nature of bilateral breast disease. Dr. Lisbeth Renshaw discusses the rationale for external radiotherapy to the breast  to reduce risks of local recurrence followed by antiestrogen therapy. Her right cancer prognostics are pending, but if there are high risk features, we may need to hold off on radiation until cleared by Dr. Geralyn Flash review of these prognostics.  We discussed the risks, benefits, short, and long term effects of radiotherapy, as well as the curative intent, and the patient is interested in proceeding. Dr. Lisbeth Renshaw discusses the delivery and logistics of radiotherapy and anticipates a course of 4 weeks of radiotherapy to bilateral breasts. Written consent is obtained and  placed in the chart, a copy was provided to the patient. She will simulate on 07/30/20.  In a visit lasting 60 minutes, greater than 50% of the time was spent face to face reviewing her case, as well as in preparation of, discussing, and coordinating the patient's care.  The above documentation reflects my direct findings during this shared patient visit. Please see the separate note by Dr. Lisbeth Renshaw on this date for the remainder of the patient's plan of care.    Carola Rhine, PAC

## 2020-07-28 ENCOUNTER — Telehealth: Payer: Self-pay | Admitting: Radiation Oncology

## 2020-07-28 NOTE — Telephone Encounter (Signed)
I called the patient to let her know that I had spoken with Dr. Lindi Adie, her prognostic panel is still pending on the right breast.  From this information Dr. Lindi Adie may order additional Oncotype specific to the right breast since the 2 cancers were different.  That being said, he would like [place] to move forward with her definitive radiotherapy to bilateral breasts, she will come in on Thursday for simulation.  She is in agreement with this plan as well.

## 2020-07-30 ENCOUNTER — Other Ambulatory Visit: Payer: Self-pay

## 2020-07-30 ENCOUNTER — Ambulatory Visit
Admission: RE | Admit: 2020-07-30 | Discharge: 2020-07-30 | Disposition: A | Discharge status: Home / Self Care | Payer: Medicare HMO | Source: Ambulatory Visit | Attending: Radiation Oncology | Admitting: Radiation Oncology

## 2020-07-30 DIAGNOSIS — Z510 Encounter for antineoplastic radiation therapy: Secondary | ICD-10-CM | POA: Insufficient documentation

## 2020-07-30 DIAGNOSIS — Z170 Estrogen receptor positive status [ER+]: Secondary | ICD-10-CM | POA: Insufficient documentation

## 2020-07-30 DIAGNOSIS — C50412 Malignant neoplasm of upper-outer quadrant of left female breast: Secondary | ICD-10-CM | POA: Insufficient documentation

## 2020-07-31 LAB — SURGICAL PATHOLOGY

## 2020-08-03 ENCOUNTER — Encounter: Payer: Self-pay | Admitting: *Deleted

## 2020-08-04 ENCOUNTER — Telehealth: Payer: Self-pay | Admitting: Hematology and Oncology

## 2020-08-04 NOTE — Telephone Encounter (Signed)
Scheduled appt per 12/20 sch msg - mailed reminder letter with appt date and time   

## 2020-08-05 DIAGNOSIS — Z510 Encounter for antineoplastic radiation therapy: Secondary | ICD-10-CM | POA: Diagnosis not present

## 2020-08-10 ENCOUNTER — Ambulatory Visit: Payer: Medicare HMO | Admitting: Radiation Oncology

## 2020-08-11 ENCOUNTER — Ambulatory Visit: Payer: Medicare HMO

## 2020-08-12 ENCOUNTER — Ambulatory Visit: Payer: Medicare HMO

## 2020-08-13 ENCOUNTER — Ambulatory Visit: Payer: Medicare HMO

## 2020-08-17 ENCOUNTER — Ambulatory Visit
Admission: RE | Admit: 2020-08-17 | Discharge: 2020-08-17 | Disposition: A | Discharge status: Home / Self Care | Payer: Medicare HMO | Source: Ambulatory Visit | Attending: Radiation Oncology | Admitting: Radiation Oncology

## 2020-08-17 ENCOUNTER — Other Ambulatory Visit: Payer: Self-pay

## 2020-08-17 DIAGNOSIS — C50011 Malignant neoplasm of nipple and areola, right female breast: Secondary | ICD-10-CM | POA: Insufficient documentation

## 2020-08-17 DIAGNOSIS — Z170 Estrogen receptor positive status [ER+]: Secondary | ICD-10-CM | POA: Insufficient documentation

## 2020-08-18 ENCOUNTER — Ambulatory Visit
Admission: RE | Admit: 2020-08-18 | Discharge: 2020-08-18 | Disposition: A | Discharge status: Home / Self Care | Payer: Medicare HMO | Source: Ambulatory Visit | Attending: Radiation Oncology | Admitting: Radiation Oncology

## 2020-08-18 DIAGNOSIS — C50011 Malignant neoplasm of nipple and areola, right female breast: Secondary | ICD-10-CM | POA: Diagnosis not present

## 2020-08-19 ENCOUNTER — Ambulatory Visit
Admission: RE | Admit: 2020-08-19 | Discharge: 2020-08-19 | Disposition: A | Discharge status: Home / Self Care | Payer: Medicare HMO | Source: Ambulatory Visit | Attending: Radiation Oncology | Admitting: Radiation Oncology

## 2020-08-19 DIAGNOSIS — C50011 Malignant neoplasm of nipple and areola, right female breast: Secondary | ICD-10-CM | POA: Diagnosis not present

## 2020-08-20 ENCOUNTER — Ambulatory Visit
Admission: RE | Admit: 2020-08-20 | Discharge: 2020-08-20 | Disposition: A | Discharge status: Home / Self Care | Payer: Medicare HMO | Source: Ambulatory Visit | Attending: Radiation Oncology | Admitting: Radiation Oncology

## 2020-08-20 ENCOUNTER — Other Ambulatory Visit: Payer: Self-pay

## 2020-08-20 DIAGNOSIS — C50011 Malignant neoplasm of nipple and areola, right female breast: Secondary | ICD-10-CM | POA: Diagnosis not present

## 2020-08-21 ENCOUNTER — Other Ambulatory Visit: Payer: Self-pay

## 2020-08-21 ENCOUNTER — Ambulatory Visit
Admission: RE | Admit: 2020-08-21 | Discharge: 2020-08-21 | Disposition: A | Discharge status: Home / Self Care | Payer: Medicare HMO | Source: Ambulatory Visit | Attending: Radiation Oncology | Admitting: Radiation Oncology

## 2020-08-21 DIAGNOSIS — C50011 Malignant neoplasm of nipple and areola, right female breast: Secondary | ICD-10-CM

## 2020-08-21 MED ORDER — RADIAPLEXRX EX GEL: Freq: Once | CUTANEOUS | Status: AC

## 2020-08-21 MED ORDER — ALRA NON-METALLIC DEODORANT (RAD-ONC)
1.0000 "application " | Freq: Once | TOPICAL | Status: AC
  Administered 2020-08-21: 1 via TOPICAL

## 2020-08-21 NOTE — Progress Notes (Signed)
Pt here for patient teaching.  Pt given Radiation and You booklet, skin care instructions, Alra deodorant and Radiaplex gel.  Reviewed areas of pertinence such as fatigue, hair loss, skin changes, breast tenderness and breast swelling . Pt able to give teach back of to pat skin and use unscented/gentle soap,apply Radiaplex bid, avoid applying anything to skin within 4 hours of treatment, avoid wearing an under wire bra and to use an electric razor if they must shave. Pt verbalizes understanding of information given and will contact nursing with any questions or concerns.     Rubbie Goostree M. Carrson Lightcap RN, BSN      

## 2020-08-24 ENCOUNTER — Ambulatory Visit
Admission: RE | Admit: 2020-08-24 | Discharge: 2020-08-24 | Disposition: A | Discharge status: Home / Self Care | Payer: Medicare HMO | Source: Ambulatory Visit | Attending: Radiation Oncology | Admitting: Radiation Oncology

## 2020-08-24 ENCOUNTER — Other Ambulatory Visit: Payer: Self-pay

## 2020-08-24 DIAGNOSIS — C50011 Malignant neoplasm of nipple and areola, right female breast: Secondary | ICD-10-CM | POA: Diagnosis not present

## 2020-08-25 ENCOUNTER — Ambulatory Visit
Admission: RE | Admit: 2020-08-25 | Discharge: 2020-08-25 | Disposition: A | Discharge status: Home / Self Care | Payer: Medicare HMO | Source: Ambulatory Visit | Attending: Radiation Oncology | Admitting: Radiation Oncology

## 2020-08-25 DIAGNOSIS — C50011 Malignant neoplasm of nipple and areola, right female breast: Secondary | ICD-10-CM | POA: Diagnosis not present

## 2020-08-26 ENCOUNTER — Other Ambulatory Visit: Payer: Self-pay

## 2020-08-26 ENCOUNTER — Ambulatory Visit
Admission: RE | Admit: 2020-08-26 | Discharge: 2020-08-26 | Disposition: A | Discharge status: Home / Self Care | Payer: Medicare HMO | Source: Ambulatory Visit | Attending: Radiation Oncology | Admitting: Radiation Oncology

## 2020-08-26 DIAGNOSIS — C50011 Malignant neoplasm of nipple and areola, right female breast: Secondary | ICD-10-CM | POA: Diagnosis not present

## 2020-08-27 ENCOUNTER — Encounter: Payer: Self-pay | Admitting: Radiation Oncology

## 2020-08-27 ENCOUNTER — Ambulatory Visit
Admission: RE | Admit: 2020-08-27 | Discharge: 2020-08-27 | Disposition: A | Discharge status: Home / Self Care | Payer: Medicare HMO | Source: Ambulatory Visit | Attending: Radiation Oncology | Admitting: Radiation Oncology

## 2020-08-27 DIAGNOSIS — C50011 Malignant neoplasm of nipple and areola, right female breast: Secondary | ICD-10-CM | POA: Diagnosis not present

## 2020-08-28 ENCOUNTER — Other Ambulatory Visit: Payer: Self-pay

## 2020-08-28 ENCOUNTER — Ambulatory Visit
Admission: RE | Admit: 2020-08-28 | Discharge: 2020-08-28 | Disposition: A | Discharge status: Home / Self Care | Payer: Medicare HMO | Source: Ambulatory Visit | Attending: Radiation Oncology | Admitting: Radiation Oncology

## 2020-08-28 ENCOUNTER — Ambulatory Visit: Payer: Medicare HMO | Admitting: Radiation Oncology

## 2020-08-28 DIAGNOSIS — C50011 Malignant neoplasm of nipple and areola, right female breast: Secondary | ICD-10-CM | POA: Diagnosis not present

## 2020-08-30 DIAGNOSIS — C50011 Malignant neoplasm of nipple and areola, right female breast: Secondary | ICD-10-CM | POA: Diagnosis not present

## 2020-08-30 NOTE — Progress Notes (Signed)
  Radiation Oncology         [phone]) [phone] ________________________________  Name: Shannon Griffith [name]  Date: 08/27/2020  DOB: [birth year]  SIMULATION NOTE   NARRATIVE:  The patient underwent simulation today for ongoing radiation therapy.  The existing CT study set was employed for the purpose of virtual treatment planning.  The target and avoidance structures were reviewed and modified as necessary.  Treatment planning then occurred.  The radiation boost prescription was entered and confirmed.  A total of 6 complex treatment devices were fabricated in the form of multi-leaf collimators to shape radiation around the targets while maximally excluding nearby normal structures:  3 photon fields for the left breast boost and 3 photon fields for the right breast boost. I have requested : Isodose Plan.    PLAN:  This modified radiation beam arrangement is intended to continue the current radiation dose to an additional 10 Gy in 4 fractions for a total cumulative dose of 52.56 Gy to each breast.    ------------------------------------------------  Jodelle Gross, MD, PhD

## 2020-08-31 ENCOUNTER — Ambulatory Visit
Admission: RE | Admit: 2020-08-31 | Discharge: 2020-08-31 | Disposition: A | Discharge status: Home / Self Care | Payer: Medicare HMO | Source: Ambulatory Visit | Attending: Radiation Oncology | Admitting: Radiation Oncology

## 2020-08-31 ENCOUNTER — Other Ambulatory Visit: Payer: Self-pay

## 2020-08-31 DIAGNOSIS — C50011 Malignant neoplasm of nipple and areola, right female breast: Secondary | ICD-10-CM | POA: Diagnosis not present

## 2020-09-01 ENCOUNTER — Ambulatory Visit
Admission: RE | Admit: 2020-09-01 | Discharge: 2020-09-01 | Disposition: A | Discharge status: Home / Self Care | Payer: Medicare HMO | Source: Ambulatory Visit | Attending: Radiation Oncology | Admitting: Radiation Oncology

## 2020-09-01 ENCOUNTER — Other Ambulatory Visit: Payer: Self-pay

## 2020-09-01 DIAGNOSIS — C50011 Malignant neoplasm of nipple and areola, right female breast: Secondary | ICD-10-CM | POA: Diagnosis not present

## 2020-09-02 ENCOUNTER — Ambulatory Visit: Payer: Medicare HMO

## 2020-09-02 ENCOUNTER — Ambulatory Visit
Admission: RE | Admit: 2020-09-02 | Discharge: 2020-09-02 | Disposition: A | Discharge status: Home / Self Care | Payer: Medicare HMO | Source: Ambulatory Visit | Attending: Radiation Oncology | Admitting: Radiation Oncology

## 2020-09-02 DIAGNOSIS — C50011 Malignant neoplasm of nipple and areola, right female breast: Secondary | ICD-10-CM | POA: Diagnosis not present

## 2020-09-03 ENCOUNTER — Ambulatory Visit: Payer: Medicare HMO

## 2020-09-03 ENCOUNTER — Ambulatory Visit
Admission: RE | Admit: 2020-09-03 | Discharge: 2020-09-03 | Disposition: A | Discharge status: Home / Self Care | Payer: Medicare HMO | Source: Ambulatory Visit | Attending: Radiation Oncology | Admitting: Radiation Oncology

## 2020-09-03 DIAGNOSIS — C50011 Malignant neoplasm of nipple and areola, right female breast: Secondary | ICD-10-CM | POA: Diagnosis not present

## 2020-09-04 ENCOUNTER — Ambulatory Visit: Payer: Medicare HMO

## 2020-09-04 ENCOUNTER — Other Ambulatory Visit: Payer: Self-pay

## 2020-09-04 ENCOUNTER — Ambulatory Visit
Admission: RE | Admit: 2020-09-04 | Discharge: 2020-09-04 | Disposition: A | Discharge status: Home / Self Care | Payer: Medicare HMO | Source: Ambulatory Visit | Attending: Radiation Oncology | Admitting: Radiation Oncology

## 2020-09-04 DIAGNOSIS — C50011 Malignant neoplasm of nipple and areola, right female breast: Secondary | ICD-10-CM | POA: Diagnosis not present

## 2020-09-06 NOTE — Progress Notes (Signed)
Patient Care Team: Chesley Noon, MD as PCP - General (Family Medicine) Mauro Kaufmann, RN as Oncology Nurse Navigator Rockwell Germany, RN as Oncology Nurse Navigator  DIAGNOSIS:    ICD-10-CM   1. Malignant neoplasm of upper-outer quadrant of left breast in female, estrogen receptor positive ([place])  C50.412    Z17.0     SUMMARY OF ONCOLOGIC HISTORY: Oncology History  Malignant neoplasm of upper-outer quadrant of left breast in female, estrogen receptor positive ([place])  06/01/2020 Initial Diagnosis   Mammogram showed a 0.7cm left upper quadrant breast mass. 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   06/24/2020 Genetic Testing   Negative genetic testing:  No pathogenic variants detected on the [facility] Common Hereditary Cancers panel. The report date is 06/24/2020.   The Common Hereditary Cancers Panel offered by [facility] includes sequencing and/or deletion duplication testing of the following 48 genes: APC, ATM, AXIN2, BARD1, BMPR1A, BRCA1, BRCA2, BRIP1, CDH1, CDK4, CDKN2A (p14ARF), CDKN2A (p16INK4a), CHEK2, CTNNA1, DICER1, EPCAM (Deletion/duplication testing only), GREM1 (promoter region deletion/duplication testing only), KIT, MEN1, MLH1, MSH2, MSH3, MSH6, MUTYH, NBN, NF1, NTHL1, PALB2, PDGFRA, PMS2, POLD1, POLE, PTEN, RAD50, RAD51C, RAD51D, RNF43, SDHB, SDHC, SDHD, SMAD4, SMARCA4. STK11, TP53, TSC1, TSC2, and VHL.  The following genes were evaluated for sequence changes only: SDHA and HOXB13 c.251G>A variant only.   06/30/2020 Surgery   Reexcision of superior, inferior, posterior, anterior, medial, lateral margins: Benign Sentinel lymph node: Negative   06/30/2020 Oncotype testing   Oncotype: Low risk: Score: 13, distant recurrence at 9 years with hormone therapy: 4%   08/18/2020 -  Radiation Therapy   Adjuvant radiation     CHIEF  COMPLIANT: Follow-up of left breast cancer to discuss antiestrogen therapy  INTERVAL HISTORY: Shannon Griffith is a 65 y.o. with above-mentioned history of left breast cancer who underwent bilateral lumpectomies and is currently on radiation. She presents to the clinic today to discuss antiestrogen therapy.  She is tolerating radiation extremely well without any problems or concerns.  She has mild radiation dermatitis.  ALLERGIES:  is allergic to cephalexin, nitrofurantoin monohyd macro, and other.  MEDICATIONS:  Current Outpatient Medications  Medication Sig Dispense Refill  . albuterol (VENTOLIN HFA) 108 (90 Base) MCG/ACT inhaler Inhale into the lungs. (Patient not taking: Reported on 07/23/2020)    . ALPRAZolam (XANAX) 0.5 MG tablet TAKE 1/2 TO 1 TABLET BY MOUTH EVERY 6 HOURS AS NEEDED    . buPROPion (WELLBUTRIN XL) 300 MG 24 hr tablet Take 300 mg by mouth at bedtime.  3  . Cholecalciferol (VITAMIN D3) 1.25 MG (50000 UT) CAPS Take by mouth.    . ibandronate (BONIVA) 150 MG tablet Take by mouth every 30 (thirty) days.     Marland Kitchen ibuprofen (ADVIL) 800 MG tablet Take 1 tablet (800 mg total) by mouth every 8 (eight) hours as needed. (Patient not taking: Reported on 07/23/2020) 30 tablet 0  . ibuprofen (ADVIL) 800 MG tablet Take 1 tablet (800 mg total) by mouth every 8 (eight) hours as needed. (Patient not taking: Reported on 07/23/2020) 30 tablet 0  . ibuprofen (ADVIL,MOTRIN) 800 MG tablet Take 1 tablet (800 mg total) by mouth every 8 (eight) hours as needed for moderate pain (mild pain). (Patient not taking: Reported on 07/23/2020) 30 tablet 1  . montelukast (SINGULAIR) 10 MG tablet Take by mouth. (Patient not taking: Reported  on 07/23/2020)    . Multiple Vitamin (MULTIVITAMIN ADULT PO) Take by mouth.    . oxyCODONE (OXY IR/ROXICODONE) 5 MG immediate release tablet Take 1 tablet (5 mg total) by mouth every 6 (six) hours as needed for severe pain. (Patient not taking: Reported on 07/23/2020) 15 tablet 0  .  oxyCODONE (OXY IR/ROXICODONE) 5 MG immediate release tablet Take 1 tablet (5 mg total) by mouth every 6 (six) hours as needed for severe pain. (Patient not taking: Reported on 07/23/2020) 15 tablet 0  . QUEtiapine Fumarate (SEROQUEL XR) 150 MG 24 hr tablet Take 150 mg by mouth at bedtime.  3  . rosuvastatin (CRESTOR) 10 MG tablet Take 10 mg by mouth at bedtime.     No current facility-administered medications for this visit.    PHYSICAL EXAMINATION: ECOG PERFORMANCE STATUS: 1 - Symptomatic but completely ambulatory  Vitals:   09/07/20 1042  BP: 134/87  Pulse: 85  Resp: 18  Temp: 98.1 F (36.7 C)  SpO2: 98%   Filed Weights   09/07/20 1042  Weight: 166 lb 9.6 oz (75.6 kg)     LABORATORY DATA:  I have reviewed the data as listed CMP Latest Ref Rng & Units 06/16/2020  Glucose 70 - 99 mg/dL 113(H)  BUN 8 - 23 mg/dL 11  Creatinine 0.44 - 1.00 mg/dL 0.74  Sodium 135 - 145 mmol/L 141  Potassium 3.5 - 5.1 mmol/L 4.7  Chloride 98 - 111 mmol/L 104  CO2 22 - 32 mmol/L 29  Calcium 8.9 - 10.3 mg/dL 9.5  Total Protein 6.5 - 8.1 g/dL 7.3  Total Bilirubin 0.3 - 1.2 mg/dL 0.5  Alkaline Phos 38 - 126 U/L 81  AST 15 - 41 U/L 24  ALT 0 - 44 U/L 25    Lab Results  Component Value Date   WBC 4.9 06/16/2020   HGB 12.9 06/16/2020   HCT 40.3 06/16/2020   MCV 92.4 06/16/2020   PLT 264 06/16/2020   NEUTROABS 2.6 06/16/2020    ASSESSMENT & PLAN:  Malignant neoplasm of upper-outer quadrant of left breast in female, estrogen receptor positive ([place]) Mammogram showed a 0.7cm left upper quadrant breast mass. Biopsy on 05/20/20 showed columnar cell hyperplasia in the right breast, and in the left breast, invasive ductal carcinoma, grade 3, HER-2 equivocal by IHC, negative by FISH (ratio 1.37), ER+ 90%, PR+ 2%, Ki67 15%  Bilateral lumpectomies: Left lumpectomy: Grade 2 IDC 0.8 cm, ER 90%, PR 2%, Ki-67 15%, HER-2 negative, superior margin focally positive, reexcision 06/30/2020: Benign, 1 sentinel  lymph node negative Right lumpectomy: Grade 2 invasive lobular cancer too small for size to characterize it, ER 40%, PR 95%, Ki-67 2%, HER-2 0 Negative  Oncotype: Low risk: Score: 13, distant recurrence at 9 years with hormone therapy: 4%  Current treatment: 1.  Adjuvant radiation started 08/18/2020 2. followed by adjuvant antiestrogen therapy with anastrozole 1 mg daily x 7 years  Anastrozole counseling:We discussed the risks and benefits of anti-estrogen therapy with aromatase inhibitors. These include but not limited to insomnia, hot flashes, mood changes, vaginal dryness, bone density loss, and weight gain. We strongly believe that the benefits far outweigh the risks. Patient understands these risks and consented to starting treatment. Planned treatment duration is 7 years.  Return to clinic in 3 months for survivorship care plan visit.  I will see her after that in 6 months     No orders of the defined types were placed in this encounter.  The patient has a  good understanding of the overall plan. she agrees with it. she will call with any problems that may develop before the next visit here.  Total time spent: 30 mins including face to face time and time spent for planning, charting and coordination of care  Nicholas Lose, MD 09/07/2020  I, Cloyde Reams Dorshimer, am acting as scribe for Dr. Nicholas Lose.  I have reviewed the above documentation for accuracy and completeness, and I agree with the above.

## 2020-09-07 ENCOUNTER — Ambulatory Visit: Payer: Medicare HMO

## 2020-09-07 ENCOUNTER — Inpatient Hospital Stay: Payer: Medicare HMO | Attending: Hematology and Oncology | Admitting: Hematology and Oncology

## 2020-09-07 ENCOUNTER — Other Ambulatory Visit: Payer: Self-pay

## 2020-09-07 ENCOUNTER — Ambulatory Visit
Admission: RE | Admit: 2020-09-07 | Discharge: 2020-09-07 | Disposition: A | Discharge status: Home / Self Care | Payer: Medicare HMO | Source: Ambulatory Visit | Attending: Radiation Oncology | Admitting: Radiation Oncology

## 2020-09-07 ENCOUNTER — Encounter: Payer: Self-pay | Admitting: *Deleted

## 2020-09-07 DIAGNOSIS — C50412 Malignant neoplasm of upper-outer quadrant of left female breast: Secondary | ICD-10-CM | POA: Diagnosis present

## 2020-09-07 DIAGNOSIS — Z170 Estrogen receptor positive status [ER+]: Secondary | ICD-10-CM | POA: Diagnosis not present

## 2020-09-07 DIAGNOSIS — C50011 Malignant neoplasm of nipple and areola, right female breast: Secondary | ICD-10-CM | POA: Diagnosis not present

## 2020-09-07 MED ORDER — ANASTROZOLE 1 MG PO TABS: 1.0000 mg | ORAL_TABLET | Freq: Every day | ORAL | 3 refills | Status: DC

## 2020-09-07 NOTE — Assessment & Plan Note (Signed)
Mammogram showed a 0.7cm left upper quadrant breast mass. Biopsy on 05/20/20 showed columnar cell hyperplasia in the right breast, and in the left breast, invasive ductal carcinoma, grade 3, HER-2 equivocal by IHC, negative by FISH (ratio 1.37), ER+ 90%, PR+ 2%, Ki67 15%  Bilateral lumpectomies: Left lumpectomy: Grade 2 IDC 0.8 cm, ER 90%, PR 2%, Ki-67 15%, HER-2 negative, superior margin focally positive, reexcision 06/30/2020: Benign, 1 sentinel lymph node negative Right lumpectomy: Grade 2 invasive lobular cancer too small for size to characterize it, ER 40%, PR 95%, Ki-67 2%, HER-2 0 Negative  Oncotype: Low risk: Score: 13, distant recurrence at 9 years with hormone therapy: 4%  Current treatment: 1.  Adjuvant radiation started 08/18/2020 2. followed by adjuvant antiestrogen therapy with anastrozole 1 mg daily x 7 years  Anastrozole counseling:We discussed the risks and benefits of anti-estrogen therapy with aromatase inhibitors. These include but not limited to insomnia, hot flashes, mood changes, vaginal dryness, bone density loss, and weight gain. We strongly believe that the benefits far outweigh the risks. Patient understands these risks and consented to starting treatment. Planned treatment duration is 7 years.  Return to clinic in 3 months for survivorship care plan visit.  I will see her after that in 6 months

## 2020-09-08 ENCOUNTER — Ambulatory Visit: Payer: Medicare HMO

## 2020-09-08 ENCOUNTER — Other Ambulatory Visit: Payer: Self-pay

## 2020-09-08 ENCOUNTER — Ambulatory Visit
Admission: RE | Admit: 2020-09-08 | Discharge: 2020-09-08 | Disposition: A | Discharge status: Home / Self Care | Payer: Medicare HMO | Source: Ambulatory Visit | Attending: Radiation Oncology | Admitting: Radiation Oncology

## 2020-09-08 DIAGNOSIS — C50011 Malignant neoplasm of nipple and areola, right female breast: Secondary | ICD-10-CM | POA: Diagnosis not present

## 2020-09-09 ENCOUNTER — Ambulatory Visit: Payer: Medicare HMO

## 2020-09-09 ENCOUNTER — Other Ambulatory Visit: Payer: Self-pay

## 2020-09-09 ENCOUNTER — Ambulatory Visit
Admission: RE | Admit: 2020-09-09 | Discharge: 2020-09-09 | Disposition: A | Discharge status: Home / Self Care | Payer: Medicare HMO | Source: Ambulatory Visit | Attending: Radiation Oncology | Admitting: Radiation Oncology

## 2020-09-09 DIAGNOSIS — C50011 Malignant neoplasm of nipple and areola, right female breast: Secondary | ICD-10-CM | POA: Diagnosis not present

## 2020-09-10 ENCOUNTER — Other Ambulatory Visit: Payer: Self-pay

## 2020-09-10 ENCOUNTER — Encounter: Payer: Self-pay | Admitting: *Deleted

## 2020-09-10 ENCOUNTER — Ambulatory Visit
Admission: RE | Admit: 2020-09-10 | Discharge: 2020-09-10 | Disposition: A | Discharge status: Home / Self Care | Payer: Medicare HMO | Source: Ambulatory Visit | Attending: Radiation Oncology | Admitting: Radiation Oncology

## 2020-09-10 DIAGNOSIS — C50011 Malignant neoplasm of nipple and areola, right female breast: Secondary | ICD-10-CM | POA: Diagnosis not present

## 2020-09-11 ENCOUNTER — Other Ambulatory Visit: Payer: Self-pay

## 2020-09-11 ENCOUNTER — Ambulatory Visit
Admission: RE | Admit: 2020-09-11 | Discharge: 2020-09-11 | Disposition: A | Discharge status: Home / Self Care | Payer: Medicare HMO | Source: Ambulatory Visit | Attending: Radiation Oncology | Admitting: Radiation Oncology

## 2020-09-11 DIAGNOSIS — C50011 Malignant neoplasm of nipple and areola, right female breast: Secondary | ICD-10-CM | POA: Diagnosis not present

## 2020-09-12 ENCOUNTER — Ambulatory Visit: Payer: Medicare HMO

## 2020-09-14 ENCOUNTER — Encounter: Payer: Self-pay | Admitting: Radiation Oncology

## 2020-09-14 NOTE — Progress Notes (Signed)
  Radiation Oncology         [phone]) [phone] ________________________________  Name: Shannon Griffith MRN: [id number]  Date: 09/14/2020  DOB: 1956-01-02  End of Treatment Note  Diagnosis:  Stage IA, pT1bN0M0 grade 2, ER/PR positive invasive ductal carcinoma of the left breast with Synchronous Stage IA, pT1bN0M0, grade 2, ER/PR positive, invasive lobular carcinoma of the right breast.     Indication for treatment:  Curative       Radiation treatment dates:   08/17/20-09/11/20  Site/dose:   The patient initially received a dose of 42.56 Gy in 16 fractions to bilateral breasts using whole-breast tangent fields. This was delivered using a 3-D conformal technique. The patient then received a boost to the seroma. This delivered an additional 10 Gy in 4 fractions using a 3 field photon technique due to the depth of the seromas. The total dose was 52.56 Gy to each breast.  Narrative: The patient tolerated radiation treatment relatively well.   The patient had some expected skin irritation as she progressed during treatment. Moist desquamation was not present at the end of treatment.  Plan: The patient has completed radiation treatment. The patient be contacted in one month by radiation oncology clinic for routine follow up. She will call or return sooner if she has any questions or concerns related to recovery or treatment. ________________________________     Carola Rhine, PAC

## 2020-09-17 ENCOUNTER — Telehealth: Payer: Self-pay | Admitting: Adult Health

## 2020-09-17 NOTE — Telephone Encounter (Signed)
Left message with upcoming appointment. Gave option to call back to reschedule if needed. 

## 2020-12-10 ENCOUNTER — Other Ambulatory Visit: Payer: Self-pay

## 2020-12-10 ENCOUNTER — Encounter: Payer: Self-pay | Admitting: Adult Health

## 2020-12-10 ENCOUNTER — Inpatient Hospital Stay: Payer: Medicare HMO | Attending: Adult Health | Admitting: Adult Health

## 2020-12-10 VITALS — BP 146/87 | HR 87 | Temp 97.7°F | Resp 18 | Ht 66.0 in | Wt 165.3 lb

## 2020-12-10 DIAGNOSIS — C50412 Malignant neoplasm of upper-outer quadrant of left female breast: Secondary | ICD-10-CM | POA: Diagnosis present

## 2020-12-10 DIAGNOSIS — E2839 Other primary ovarian failure: Secondary | ICD-10-CM

## 2020-12-10 DIAGNOSIS — Z170 Estrogen receptor positive status [ER+]: Secondary | ICD-10-CM | POA: Diagnosis not present

## 2020-12-10 DIAGNOSIS — C50011 Malignant neoplasm of nipple and areola, right female breast: Secondary | ICD-10-CM | POA: Diagnosis not present

## 2020-12-10 DIAGNOSIS — M810 Age-related osteoporosis without current pathological fracture: Secondary | ICD-10-CM | POA: Insufficient documentation

## 2020-12-10 DIAGNOSIS — Z923 Personal history of irradiation: Secondary | ICD-10-CM | POA: Diagnosis not present

## 2020-12-10 NOTE — Progress Notes (Signed)
Mammogram order successfully faxed to [facility].

## 2020-12-10 NOTE — Progress Notes (Signed)
SURVIVORSHIP VISIT:   BRIEF ONCOLOGIC HISTORY:  Oncology History  Malignant neoplasm of upper-outer quadrant of left breast in female, estrogen receptor positive ([place])  06/01/2020 Initial Diagnosis   Mammogram showed a 0.7cm left upper quadrant breast mass. 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   06/17/2020 Surgery   Bilateral lumpectomies ([facility]) [phone]):  Left lumpectomy: Grade 2 IDC 0.8 cm, ER 90%, PR 2%, Ki-67 15%, HER-2 negative, superior margin focally positive, reexcision 06/30/2020: Benign, 1 sentinel lymph node negative  Right lumpectomy: Grade 2 invasive lobular cancer too small for size to characterize it, ER 40%, PR 95%, Ki-67 2%, HER-2 0 Negative   06/24/2020 Genetic Testing   Negative genetic testing:  No pathogenic variants detected on the [facility] Common Hereditary Cancers panel. The report date is 06/24/2020.   The Common Hereditary Cancers Panel offered by [facility] includes sequencing and/or deletion duplication testing of the following 48 genes: APC, ATM, AXIN2, BARD1, BMPR1A, BRCA1, BRCA2, BRIP1, CDH1, CDK4, CDKN2A (p14ARF), CDKN2A (p16INK4a), CHEK2, CTNNA1, DICER1, EPCAM (Deletion/duplication testing only), GREM1 (promoter region deletion/duplication testing only), KIT, MEN1, MLH1, MSH2, MSH3, MSH6, MUTYH, NBN, NF1, NTHL1, PALB2, PDGFRA, PMS2, POLD1, POLE, PTEN, RAD50, RAD51C, RAD51D, RNF43, SDHB, SDHC, SDHD, SMAD4, SMARCA4. STK11, TP53, TSC1, TSC2, and VHL.  The following genes were evaluated for sequence changes only: SDHA and HOXB13 c.251G>A variant only.   06/30/2020 Surgery   Reexcision of LEFT superior, inferior, posterior, anterior, medial, lateral margins: Benign Sentinel lymph node: Negative   07/03/2020 Oncotype testing   Oncotype: Low risk: Score: 13, distant recurrence at 9 years with hormone therapy: 4%    08/17/2020 - 09/11/2020 Radiation Therapy   The patient initially received a dose of 42.56 Gy in 16 fractions to bilateral breasts using whole-breast tangent fields. This was delivered using a 3-D conformal technique. The patient then received a boost to the seroma. This delivered an additional 10 Gy in 4 fractions using a 3 field photon technique due to the depth of the seromas. The total dose was 52.56 Gy to each breast.   08/2020 - 08/2027 Anti-estrogen oral therapy   Anastrozole     INTERVAL HISTORY:  Shannon Griffith to review her survivorship care plan detailing her treatment course for breast cancer, as well as monitoring long-term side effects of that treatment, education regarding health maintenance, screening, and overall wellness and health promotion.     Overall, Shannon Griffith reports feeling quite well.  She is taking Anastrozole daily and tolerates it quite well.  She denies any issues today and her survivorship survey was negative.  REVIEW OF SYSTEMS:  Review of Systems  Constitutional: Negative for appetite change, chills, fatigue, fever and unexpected weight change.  HENT:   Negative for hearing loss, lump/mass and trouble swallowing.   Eyes: Negative for eye problems and icterus.  Respiratory: Negative for chest tightness, cough and shortness of breath.   Cardiovascular: Negative for chest pain, leg swelling and palpitations.  Gastrointestinal: Negative for abdominal distention, abdominal pain, constipation, diarrhea, nausea and vomiting.  Endocrine: Negative for hot flashes.  Genitourinary: Negative for difficulty urinating.   Musculoskeletal: Negative for arthralgias.  Skin: Negative for itching and rash.  Neurological: Negative for dizziness, extremity weakness, headaches and numbness.  Hematological: Negative for adenopathy. Does not bruise/bleed easily.  Psychiatric/Behavioral: Negative for depression. The patient is not nervous/anxious.   Breast: Denies any new  nodularity,  masses, tenderness, nipple changes, or nipple discharge.      ONCOLOGY TREATMENT TEAM:  1. Surgeon:  Dr. Brantley Stage at [facility] 2. Medical Oncologist: Dr. Lindi Adie  3. Radiation Oncologist: Dr. Lisbeth Renshaw    PAST MEDICAL/SURGICAL HISTORY:  Past Medical History:  Diagnosis Date  . Anxiety   . Arthritis    fingers  . Cancer ([place]) 05/2020   left breast IDC  . Depression   . Family history of breast cancer   . Family history of lung cancer   . Family history of melanoma   . Family history of throat cancer   . Hyperlipidemia    Past Surgical History:  Procedure Laterality Date  . ABDOMINAL HYSTERECTOMY    . ANTERIOR AND POSTERIOR REPAIR WITH SACROSPINOUS FIXATION N/A 07/12/2016   Procedure: ANTERIOR AND POSTERIOR REPAIR WITH SACROSPINOUS FIXATION;  Surgeon: Molli Posey, MD;  Location: [facility];  Service: Gynecology;  Laterality: N/A;  . AXILLARY LYMPH NODE BIOPSY Right 06/30/2020   Procedure: RIGHT AXILLARY SENTINEL LYMPH NODE BIOPSY;  Surgeon: Erroll Luna, MD;  Location: [facility];  Service: General;  Laterality: Right;  . BREAST LUMPECTOMY WITH RADIOACTIVE SEED AND SENTINEL LYMPH NODE BIOPSY Bilateral 06/17/2020   Procedure: BILATERAL BREAST LUMPECTOMY WITH RADIOACTIVE SEED AND LEFT SENTINEL LYMPH NODE MAPPING;  Surgeon: Erroll Luna, MD;  Location: [facility];  Service: General;  Laterality: Bilateral;  . CHOLECYSTECTOMY    . COLONOSCOPY    . CYSTOSCOPY N/A 07/12/2016   Procedure: CYSTOSCOPY;  Surgeon: Molli Posey, MD;  Location: [facility];  Service: Gynecology;  Laterality: N/A;  . DILATION AND CURETTAGE OF UTERUS     twice  . LAPAROSCOPIC VAGINAL HYSTERECTOMY WITH SALPINGO OOPHORECTOMY Bilateral 07/12/2016   Procedure: LAPAROSCOPIC ASSISTED VAGINAL HYSTERECTOMY WITH SALPINGO OOPHORECTOMY;  Surgeon: Molli Posey, MD;  Location: [facility];  Service: Gynecology;  Laterality: Bilateral;  . RE-EXCISION OF BREAST LUMPECTOMY Left  06/30/2020   Procedure: RE EXCISION LEFT BREAST LUMPECTOMY;  Surgeon: Erroll Luna, MD;  Location: [facility];  Service: General;  Laterality: Left;     ALLERGIES:  Allergies  Allergen Reactions  . Cephalexin Rash  . Nitrofurantoin Monohyd Macro Nausea And Vomiting  . Other Nausea And Vomiting    Nitrofuratoin     CURRENT MEDICATIONS:  Outpatient Encounter Medications as of 12/10/2020  Medication Sig  . albuterol (VENTOLIN HFA) 108 (90 Base) MCG/ACT inhaler Inhale into the lungs.  . ALPRAZolam (XANAX) 0.5 MG tablet TAKE 1/2 TO 1 TABLET BY MOUTH EVERY 6 HOURS AS NEEDED  . anastrozole (ARIMIDEX) 1 MG tablet Take 1 tablet (1 mg total) by mouth daily.  Marland Kitchen buPROPion (WELLBUTRIN XL) 300 MG 24 hr tablet Take 300 mg by mouth at bedtime.  . Cholecalciferol (VITAMIN D3) 1.25 MG (50000 UT) CAPS Take by mouth.  . ibandronate (BONIVA) 150 MG tablet Take by mouth every 30 (thirty) days.   . montelukast (SINGULAIR) 10 MG tablet Take by mouth.  . Multiple Vitamin (MULTIVITAMIN ADULT PO) Take by mouth.  . QUEtiapine Fumarate (SEROQUEL XR) 150 MG 24 hr tablet Take 150 mg by mouth at bedtime.  . rosuvastatin (CRESTOR) 10 MG tablet Take 10 mg by mouth at bedtime.  . [DISCONTINUED] ibuprofen (ADVIL) 800 MG tablet Take 1 tablet (800 mg total) by mouth every 8 (eight) hours as needed. (Patient not taking: No sig reported)  . [DISCONTINUED] ibuprofen (ADVIL) 800 MG tablet Take 1 tablet (800 mg total) by mouth every 8 (eight) hours as  needed. (Patient not taking: Reported on 12/10/2020)  . [DISCONTINUED] ibuprofen (ADVIL,MOTRIN) 800 MG tablet Take 1 tablet (800 mg total) by mouth every 8 (eight) hours as needed for moderate pain (mild pain). (Patient not taking: Reported on 07/23/2020)  . [DISCONTINUED] oxyCODONE (OXY IR/ROXICODONE) 5 MG immediate release tablet Take 1 tablet (5 mg total) by mouth every 6 (six) hours as needed for severe pain. (Patient not taking: Reported on 07/23/2020)  .  [DISCONTINUED] oxyCODONE (OXY IR/ROXICODONE) 5 MG immediate release tablet Take 1 tablet (5 mg total) by mouth every 6 (six) hours as needed for severe pain. (Patient not taking: No sig reported)   No facility-administered encounter medications on file as of 12/10/2020.     ONCOLOGIC FAMILY HISTORY:  Family History  Problem Relation Age of Onset  . Melanoma Brother 60  . Breast cancer Daughter 61       Stage III lobular carcinoma  . Lung cancer Brother 41  . Throat cancer Paternal Uncle        dx 55s, smoker  . Stroke Paternal Grandmother   . Breast cancer Paternal Grandmother        dx early 24s, b/l mastectomies     GENETIC COUNSELING/TESTING: See above  SOCIAL HISTORY:  Social History   Socioeconomic History  . Marital status: Married    Spouse name: Not on file  . Number of children: Not on file  . Years of education: Not on file  . Highest education level: Not on file  Occupational History  . Not on file  Tobacco Use  . Smoking status: Never Smoker  . Smokeless tobacco: Never Used  Vaping Use  . Vaping Use: Never used  Substance and Sexual Activity  . Alcohol use: Yes    Comment: occ  . Drug use: No  . Sexual activity: Not on file  Other Topics Concern  . Not on file  Social History Narrative  . Not on file   Social Determinants of Health   Financial Resource Strain: Not on file  Food Insecurity: Not on file  Transportation Needs: Not on file  Physical Activity: Not on file  Stress: Not on file  Social Connections: Not on file  Intimate Partner Violence: Not on file     OBSERVATIONS/OBJECTIVE:  BP (!) 146/87 (BP Location: Right Arm, Patient Position: Sitting)   Pulse 87   Temp 97.7 F (36.5 C) (Temporal)   Resp 18   Ht 5' 6"  (1.676 m)   Wt 165 lb 4.8 oz (75 kg)   SpO2 99%   BMI 26.68 kg/m  GENERAL: Patient is a well appearing female in no acute distress HEENT:  Sclerae anicteric.  Mask in place. Neck is supple.  NODES:  No cervical,  supraclavicular, or axillary lymphadenopathy palpated.  BREAST EXAM:  S/p bilateral lumpectomies and radiation, no sign of local recurrence.   LUNGS:  Clear to auscultation bilaterally.  No wheezes or rhonchi. HEART:  Regular rate and rhythm. No murmur appreciated. ABDOMEN:  Soft, nontender.  Positive, normoactive bowel sounds. No organomegaly palpated. MSK:  No focal spinal tenderness to palpation. Full range of motion bilaterally in the upper extremities. EXTREMITIES:  No peripheral edema.   SKIN:  Clear with no obvious rashes or skin changes. No nail dyscrasia. NEURO:  Nonfocal. Well oriented.  Appropriate affect.    LABORATORY DATA:  None for this visit.  DIAGNOSTIC IMAGING:  None for this visit.      ASSESSMENT AND PLAN:  Shannon Griffith is a  pleasant 65 y.o. female with Stage IA bilateral breast cancer, ER+/PR+/HER2-, diagnosed in 05/2020, treated with bilateral lumpectomies, adjuvant radiation therapy, and anti-estrogen therapy with Anastrozole beginning in 08/2020.  She presents to the Survivorship Clinic for our initial meeting and routine follow-up post-completion of treatment for breast cancer.    1. Bilateral breast cancer:  Shannon Griffith is continuing to recover from definitive treatment for breast cancer. She will follow-up with her medical oncologist, Dr. Lindi Adie in 6 months with history and physical exam per surveillance protocol.  She will continue her anti-estrogen therapy with Anastrozole. Thus far, she is tolerating the Anastrozole well, with minimal side effects. She was instructed to make Dr. Lindi Adie or myself aware if she begins to experience any worsening side effects of the medication and I could see her back in clinic to help manage those side effects, as needed. Her mammogram is due 04/2021; orders placed today.  She will undergo her mammogram, then see Dr. Lindi Adie at which point they will discuss the need of breast MRI.  She really does not want to undergo this.    Today, a  comprehensive survivorship care plan and treatment summary was reviewed with the patient today detailing her breast cancer diagnosis, treatment course, potential late/long-term effects of treatment, appropriate follow-up care with recommendations for the future, and patient education resources.  A copy of this summary, along with a letter will be sent to the patient's primary care provider via mail/fax/In Basket message after today's visit.    2. Bone health:  Given Shannon Griffith's age/history of breast cancer and her current treatment regimen including anti-estrogen therapy with Anastrozole, she is at risk for bone demineralization.  She takes Boniva every 30 days. Her last DEXA scan was 02/2021 and was consistent with osteoporosis with a T score of -2.6 in her AP spine.  She is due for repeat 03/2021.  In the meantime, she was encouraged to increase her consumption of foods rich in calcium, as well as increase her weight-bearing activities.  She was given education on specific activities to promote bone health.  3. Cancer screening:  Due to Shannon Griffith's history and her age, she should receive screening for skin cancers, colon cancer, and gynecologic cancers.  The information and recommendations are listed on the patient's comprehensive care plan/treatment summary and were reviewed in detail with the patient.    4. Health maintenance and wellness promotion: Shannon Griffith was encouraged to consume 5-7 servings of fruits and vegetables per day. We reviewed the "Nutrition Rainbow" handout, as well as the handout "Take Control of Your Health and Reduce Your Cancer Risk" from the [place].  She was also encouraged to engage in moderate to vigorous exercise for 30 minutes per day most days of the week. We discussed the [employer] fitness program, which is designed for cancer survivors to help them become more physically fit after cancer treatments.  She was instructed to limit her alcohol consumption and  continue to abstain from tobacco use.     5. Support services/counseling: It is not uncommon for this period of the patient's cancer care trajectory to be one of many emotions and stressors.  We discussed how this can be increasingly difficult during the times of quarantine and social distancing due to the COVID-19 pandemic.   She was given information regarding our available services and encouraged to contact me with any questions or for help enrolling in any of our support group/programs.    Follow up instructions:    -  Return to cancer center 6 months for f/u with Dr. Lindi Adie  -Mammogram due in 03/2021 -Bone density due in 03/2021 -Follow up with surgery 1 year -She is welcome to return back to the Survivorship Clinic at any time; no additional follow-up needed at this time.  -Consider referral back to survivorship as a long-term survivor for continued surveillance  The patient was provided an opportunity to ask questions and all were answered. The patient agreed with the plan and demonstrated an understanding of the instructions.   Total encounter time: 40 minutes* spent preparing SCP, printing, communicating with oncology, surgery and PCP, obtaining and reviewing tests off of [facility], and documentation.  Wilber Bihari, NP 12/10/20 11:11 AM Medical Oncology and Hematology [facility]  [address], [place] [address] Tel. [phone]    Fax. [phone]  *Total Encounter Time as defined by the Centers for Medicare and Medicaid Services includes, in addition to the face-to-face time of a patient visit (documented in the note above) non-face-to-face time: obtaining and reviewing outside history, ordering and reviewing medications, tests or procedures, care coordination (communications with other health care professionals or caregivers) and documentation in the medical record.

## 2020-12-14 ENCOUNTER — Telehealth: Payer: Self-pay | Admitting: Hematology and Oncology

## 2020-12-14 NOTE — Telephone Encounter (Signed)
Scheduled per 4/28 los. Called pt and left a msg 

## 2021-06-09 NOTE — Progress Notes (Incomplete)
Patient Care Team: Chesley Noon, MD as PCP - General (Family Medicine) Nicholas Lose, MD as Consulting Physician (Hematology and Oncology) Kyung Rudd, MD as Consulting Physician (Radiation Oncology) Erroll Luna, MD as Consulting Physician (General Surgery)  DIAGNOSIS: No diagnosis found.  SUMMARY OF ONCOLOGIC HISTORY: Oncology History  Malignant neoplasm of upper-outer quadrant of left breast in female, estrogen receptor positive ([place])  06/01/2020 Initial Diagnosis   Mammogram showed a 0.7cm left upper quadrant breast mass. 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   06/17/2020 Surgery   Bilateral lumpectomies ([facility]) [phone]):  Left lumpectomy: Grade 2 IDC 0.8 cm, ER 90%, PR 2%, Ki-67 15%, HER-2 negative, superior margin focally positive, reexcision 06/30/2020: Benign, 1 sentinel lymph node negative  Right lumpectomy: Grade 2 invasive lobular cancer too small for size to characterize it, ER 40%, PR 95%, Ki-67 2%, HER-2 0 Negative   06/24/2020 Genetic Testing   Negative genetic testing:  No pathogenic variants detected on the [facility] Common Hereditary Cancers panel. The report date is 06/24/2020.   The Common Hereditary Cancers Panel offered by [facility] includes sequencing and/or deletion duplication testing of the following 48 genes: APC, ATM, AXIN2, BARD1, BMPR1A, BRCA1, BRCA2, BRIP1, CDH1, CDK4, CDKN2A (p14ARF), CDKN2A (p16INK4a), CHEK2, CTNNA1, DICER1, EPCAM (Deletion/duplication testing only), GREM1 (promoter region deletion/duplication testing only), KIT, MEN1, MLH1, MSH2, MSH3, MSH6, MUTYH, NBN, NF1, NTHL1, PALB2, PDGFRA, PMS2, POLD1, POLE, PTEN, RAD50, RAD51C, RAD51D, RNF43, SDHB, SDHC, SDHD, SMAD4, SMARCA4. STK11, TP53, TSC1, TSC2, and VHL.  The following genes were evaluated for sequence changes only: SDHA and HOXB13 c.251G>A  variant only.   06/30/2020 Surgery   Reexcision of LEFT superior, inferior, posterior, anterior, medial, lateral margins: Benign Sentinel lymph node: Negative   07/03/2020 Oncotype testing   Oncotype: Low risk: Score: 13, distant recurrence at 9 years with hormone therapy: 4%   08/17/2020 - 09/11/2020 Radiation Therapy   The patient initially received a dose of 42.56 Gy in 16 fractions to bilateral breasts using whole-breast tangent fields. This was delivered using a 3-D conformal technique. The patient then received a boost to the seroma. This delivered an additional 10 Gy in 4 fractions using a 3 field photon technique due to the depth of the seromas. The total dose was 52.56 Gy to each breast.   08/2020 - 08/2027 Anti-estrogen oral therapy   Anastrozole     CHIEF COMPLIANT: Follow-up of left breast cancer to discuss antiestrogen therapy  INTERVAL HISTORY: Shannon Griffith is a 65 y.o. with above-mentioned history of left breast cancer who underwent bilateral lumpectomies and radiation, currently on antiestrogen therapy with anastrozole. She presents to the clinic today for follow-up.   ALLERGIES:  is allergic to cephalexin, nitrofurantoin monohyd macro, and other.  MEDICATIONS:  Current Outpatient Medications  Medication Sig Dispense Refill   albuterol (VENTOLIN HFA) 108 (90 Base) MCG/ACT inhaler Inhale into the lungs.     ALPRAZolam (XANAX) 0.5 MG tablet TAKE 1/2 TO 1 TABLET BY MOUTH EVERY 6 HOURS AS NEEDED     anastrozole (ARIMIDEX) 1 MG tablet Take 1 tablet (1 mg total) by mouth daily. 90 tablet 3   buPROPion (WELLBUTRIN XL) 300 MG 24 hr tablet Take 300 mg by mouth at bedtime.  3   Cholecalciferol (VITAMIN D3) 1.25 MG (50000 UT) CAPS Take by mouth.     montelukast (SINGULAIR) 10 MG tablet Take  by mouth.     Multiple Vitamin (MULTIVITAMIN ADULT PO) Take by mouth.     QUEtiapine Fumarate (SEROQUEL XR) 150 MG 24 hr tablet Take 150 mg by mouth at bedtime.  3   rosuvastatin (CRESTOR) 10 MG  tablet Take 10 mg by mouth at bedtime.     No current facility-administered medications for this visit.    PHYSICAL EXAMINATION: ECOG PERFORMANCE STATUS: {CHL ONC ECOG PS:[id number]}  There were no vitals filed for this visit. There were no vitals filed for this visit.  BREAST:*** No palpable masses or nodules in either right or left breasts. No palpable axillary supraclavicular or infraclavicular adenopathy no breast tenderness or nipple discharge. (exam performed in the presence of a chaperone)  LABORATORY DATA:  I have reviewed the data as listed CMP Latest Ref Rng & Units 06/16/2020  Glucose 70 - 99 mg/dL 113(H)  BUN 8 - 23 mg/dL 11  Creatinine 0.44 - 1.00 mg/dL 0.74  Sodium 135 - 145 mmol/L 141  Potassium 3.5 - 5.1 mmol/L 4.7  Chloride 98 - 111 mmol/L 104  CO2 22 - 32 mmol/L 29  Calcium 8.9 - 10.3 mg/dL 9.5  Total Protein 6.5 - 8.1 g/dL 7.3  Total Bilirubin 0.3 - 1.2 mg/dL 0.5  Alkaline Phos 38 - 126 U/L 81  AST 15 - 41 U/L 24  ALT 0 - 44 U/L 25    Lab Results  Component Value Date   WBC 4.9 06/16/2020   HGB 12.9 06/16/2020   HCT 40.3 06/16/2020   MCV 92.4 06/16/2020   PLT 264 06/16/2020   NEUTROABS 2.6 06/16/2020    ASSESSMENT & PLAN:  No problem-specific Assessment & Plan notes found for this encounter.    No orders of the defined types were placed in this encounter.  The patient has a good understanding of the overall plan. she agrees with it. she will call with any problems that may develop before the next visit here.  Total time spent: *** mins including face to face time and time spent for planning, charting and coordination of care  Rulon Eisenmenger, MD, MPH 06/09/2021  I, Thana Ates, am acting as scribe for Dr. Nicholas Lose.  {insert scribe attestation}

## 2021-06-11 ENCOUNTER — Ambulatory Visit: Payer: Medicare HMO | Admitting: Hematology and Oncology

## 2021-06-11 NOTE — Assessment & Plan Note (Deleted)
Mammogram showed a 0.7cm left upper quadrant breast mass. Biopsy on 05/20/20 showed columnar cell hyperplasia in the right breast, and in the left breast, invasive ductal carcinoma, grade 3, HER-2 equivocal by IHC, negative by FISH (ratio 1.37), ER+ 90%, PR+ 2%, Ki67 15%  Bilateral lumpectomies: Left lumpectomy: Grade 2 IDC 0.8 cm, ER 90%, PR 2%, Ki-67 15%, HER-2 negative, superior margin focally positive, reexcision 06/30/2020: Benign, 1 sentinel lymph node negative Right lumpectomy: Grade 2 invasive lobular cancer too small for size to characterize it, ER 40%, PR 95%, Ki-67 2%, HER-2 0 Negative  Oncotype: Low risk: Score: 13, distant recurrence at 9 years with hormone therapy: 4%  Current treatment: 1.  Adjuvant radiation started 08/18/2020 2. followed by adjuvant antiestrogen therapy with anastrozole 1 mg daily x 7 years  Anastrozole toxicities:  Breast cancer surveillance: 1.  Breast exam 06/11/2021: Benign 2. mammogram has been ordered  Return to clinic in 1 year for follow-up

## 2021-09-28 ENCOUNTER — Encounter (HOSPITAL_COMMUNITY): Payer: Self-pay

## 2021-09-29 ENCOUNTER — Other Ambulatory Visit: Payer: Self-pay | Admitting: Hematology and Oncology

## 2021-10-29 ENCOUNTER — Telehealth: Payer: Self-pay | Admitting: Hematology and Oncology

## 2021-10-29 NOTE — Telephone Encounter (Signed)
.  Called patient to schedule appointment per 2/15 inbasket, patient is aware of date and time.   ?Pt requested earlier appt due to unknown mark on breasts, appt r/s per pt req ?

## 2021-11-02 ENCOUNTER — Inpatient Hospital Stay: Payer: Medicare HMO | Attending: Hematology and Oncology | Admitting: Hematology and Oncology

## 2021-11-02 ENCOUNTER — Other Ambulatory Visit: Payer: Self-pay

## 2021-11-02 DIAGNOSIS — C50412 Malignant neoplasm of upper-outer quadrant of left female breast: Secondary | ICD-10-CM | POA: Insufficient documentation

## 2021-11-02 DIAGNOSIS — Z79811 Long term (current) use of aromatase inhibitors: Secondary | ICD-10-CM | POA: Insufficient documentation

## 2021-11-02 DIAGNOSIS — Z170 Estrogen receptor positive status [ER+]: Secondary | ICD-10-CM | POA: Diagnosis not present

## 2021-11-02 MED ORDER — ANASTROZOLE 1 MG PO TABS: 1.0000 mg | ORAL_TABLET | Freq: Every day | ORAL | 3 refills | Status: DC

## 2021-11-02 NOTE — Assessment & Plan Note (Signed)
Mammogram showed a 0.7cm left upper quadrant breast mass. Biopsy on 05/20/20 showed columnar cell hyperplasia in the right breast, and in the left breast, invasive ductal carcinoma, grade 3, HER-2 equivocal by IHC, negative by FISH (ratio 1.37), ER+ 90%, PR+ 2%, Ki67 15% ?? ?Bilateral lumpectomies: ?Left lumpectomy: Grade 2 IDC 0.8 cm, ER 90%, PR 2%, Ki-67 15%, HER-2 negative, superior margin focally positive, reexcision 06/30/2020: Benign, 1 sentinel lymph node negative ?Right lumpectomy: Grade 2 invasive lobular cancer too small for size to characterize it, ER 40%, PR 95%, Ki-67 2%, HER-2 0 Negative ?? ?Oncotype: Low risk: Score: 13, distant recurrence at 9 years with hormone therapy: 4% ?? ?Current treatment: ?1.  Adjuvant radiation started 08/18/2020 ?2. followed by adjuvant antiestrogen therapy with anastrozole 1 mg daily x 7 years ?? ?Anastrozole toxicities: ? ?Breast cancer surveillance: ?1.  Breast exam 11/02/2021: Benign ?2. mammogram has been ordered. ? ?Return to clinic in 1 year for follow-up ?

## 2021-11-02 NOTE — Progress Notes (Signed)
? ?Patient Care Team: ?Chesley Noon, MD as PCP - General (Family Medicine) ?Nicholas Lose, MD as Consulting Physician (Hematology and Oncology) ?Kyung Rudd, MD as Consulting Physician (Radiation Oncology) ?Erroll Luna, MD as Consulting Physician (General Surgery) ? ?DIAGNOSIS:  ?Encounter Diagnosis  ?Name Primary?  ? Malignant neoplasm of upper-outer quadrant of left breast in female, estrogen receptor positive ([place])   ? ? ?SUMMARY OF ONCOLOGIC HISTORY: ?Oncology History  ?Malignant neoplasm of upper-outer quadrant of left breast in female, estrogen receptor positive ([place])  ?06/01/2020 Initial Diagnosis  ? Mammogram showed a 0.7cm left upper quadrant breast mass. 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 ?  ?06/17/2020 Surgery  ? Bilateral lumpectomies ([place]) [phone]): ? ?Left lumpectomy: Grade 2 IDC 0.8 cm, ER 90%, PR 2%, Ki-67 15%, HER-2 negative, superior margin focally positive, reexcision 06/30/2020: Benign, 1 sentinel lymph node negative ? ?Right lumpectomy: Grade 2 invasive lobular cancer too small for size to characterize it, ER 40%, PR 95%, Ki-67 2%, HER-2 0 Negative ?  ?06/24/2020 Genetic Testing  ? Negative genetic testing:  No pathogenic variants detected on the [facility] Common Hereditary Cancers panel. The report date is 06/24/2020.  ? ?The Common Hereditary Cancers Panel offered by [facility] includes sequencing and/or deletion duplication testing of the following 48 genes: APC, ATM, AXIN2, BARD1, BMPR1A, BRCA1, BRCA2, BRIP1, CDH1, CDK4, CDKN2A (p14ARF), CDKN2A (p16INK4a), CHEK2, CTNNA1, DICER1, EPCAM (Deletion/duplication testing only), GREM1 (promoter region deletion/duplication testing only), KIT, MEN1, MLH1, MSH2, MSH3, MSH6, MUTYH, NBN, NF1, NTHL1, PALB2, PDGFRA, PMS2, POLD1, POLE, PTEN, RAD50, RAD51C, RAD51D, RNF43, SDHB, SDHC, SDHD, SMAD4,  SMARCA4. STK11, TP53, TSC1, TSC2, and VHL.  The following genes were evaluated for sequence changes only: SDHA and HOXB13 c.251G>A variant only. ?  ?06/30/2020 Surgery  ? Reexcision of LEFT superior, inferior, posterior, anterior, medial, lateral margins: Benign ?Sentinel lymph node: Negative ?  ?07/03/2020 Oncotype testing  ? Oncotype: Low risk: Score: 13, distant recurrence at 9 years with hormone therapy: 4% ?  ?08/17/2020 - 09/11/2020 Radiation Therapy  ? The patient initially received a dose of 42.56 Gy in 16 fractions to bilateral breasts using whole-breast tangent fields. This was delivered using a 3-D conformal technique. The patient then received a boost to the seroma. This delivered an additional 10 Gy in 4 fractions using a 3 field photon technique due to the depth of the seromas. The total dose was 52.56 Gy to each breast. ?  ?08/2020 - 08/2027 Anti-estrogen oral therapy  ? Anastrozole ?  ? ? ?CHIEF COMPLIANT: Follow-up of left breast cancer to discuss antiestrogen therapy ? ?INTERVAL HISTORY: Shannon Griffith is a 66 y.o. with above-mentioned history of left breast cancer who underwent bilateral lumpectomies and is currently on radiation. She presents to the clinic today to discuss antiestrogen therapy. She states that ?She was tolerating the antiestrogen without any concerns. ? ?ALLERGIES:  is allergic to cephalexin, nitrofurantoin monohyd macro, and other. ? ?MEDICATIONS:  ?Current Outpatient Medications  ?Medication Sig Dispense Refill  ? albuterol (VENTOLIN HFA) 108 (90 Base) MCG/ACT inhaler Inhale into the lungs.    ? ALPRAZolam (XANAX) 0.5 MG tablet TAKE 1/2 TO 1 TABLET BY MOUTH EVERY 6 HOURS AS NEEDED    ? anastrozole (ARIMIDEX) 1 MG tablet Take 1 tablet (1 mg total) by mouth daily. 90 tablet 3  ? buPROPion (WELLBUTRIN XL) 300 MG 24 hr tablet Take  300 mg by mouth at bedtime.  3  ? Cholecalciferol (VITAMIN D3) 1.25 MG (50000 UT) CAPS Take by mouth.    ? montelukast (SINGULAIR) 10 MG tablet Take by mouth.     ? Multiple Vitamin (MULTIVITAMIN ADULT PO) Take by mouth.    ? QUEtiapine Fumarate (SEROQUEL XR) 150 MG 24 hr tablet Take 150 mg by mouth at bedtime.  3  ? rosuvastatin (CRESTOR) 10 MG tablet Take 10 mg by mouth at bedtime.    ? ?No current facility-administered medications for this visit.  ? ? ?PHYSICAL EXAMINATION: ?ECOG PERFORMANCE STATUS: 1 - Symptomatic but completely ambulatory ? ?Vitals:  ? 11/02/21 1427  ?BP: 135/82  ?Pulse: 97  ?Resp: 17  ?Temp: (!) 97.5 ?F (36.4 ?C)  ?SpO2: 99%  ? ?Filed Weights  ? 11/02/21 1427  ?Weight: 159 lb 3.2 oz (72.2 kg)  ? ? ?BREAST: No palpable masses or nodules in either right or left breasts. No palpable axillary supraclavicular or infraclavicular adenopathy no breast tenderness or nipple discharge. (exam performed in the presence of a chaperone) ? ?LABORATORY DATA:  ?I have reviewed the data as listed ?CMP Latest Ref Rng & Units 06/16/2020  ?Glucose 70 - 99 mg/dL 113(H)  ?BUN 8 - 23 mg/dL 11  ?Creatinine 0.44 - 1.00 mg/dL 0.74  ?Sodium 135 - 145 mmol/L 141  ?Potassium 3.5 - 5.1 mmol/L 4.7  ?Chloride 98 - 111 mmol/L 104  ?CO2 22 - 32 mmol/L 29  ?Calcium 8.9 - 10.3 mg/dL 9.5  ?Total Protein 6.5 - 8.1 g/dL 7.3  ?Total Bilirubin 0.3 - 1.2 mg/dL 0.5  ?Alkaline Phos 38 - 126 U/L 81  ?AST 15 - 41 U/L 24  ?ALT 0 - 44 U/L 25  ? ? ?Lab Results  ?Component Value Date  ? WBC 4.9 06/16/2020  ? HGB 12.9 06/16/2020  ? HCT 40.3 06/16/2020  ? MCV 92.4 06/16/2020  ? PLT 264 06/16/2020  ? NEUTROABS 2.6 06/16/2020  ? ? ?ASSESSMENT & PLAN:  ?Malignant neoplasm of upper-outer quadrant of left breast in female, estrogen receptor positive ([place]) ?Mammogram showed a 0.7cm left upper quadrant breast mass. Biopsy on 05/20/20 showed columnar cell hyperplasia in the right breast, and in the left breast, invasive ductal carcinoma, grade 3, HER-2 equivocal by IHC, negative by FISH (ratio 1.37), ER+ 90%, PR+ 2%, Ki67 15% ?  ?Bilateral lumpectomies: ?Left lumpectomy: Grade 2 IDC 0.8 cm, ER 90%, PR 2%, Ki-67  15%, HER-2 negative, superior margin focally positive, reexcision 06/30/2020: Benign, 1 sentinel lymph node negative ?Right lumpectomy: Grade 2 invasive lobular cancer too small for size to characterize it, ER 40%, PR 95%, Ki-67 2%, HER-2 0 Negative ?  ?Oncotype: Low risk: Score: 13, distant recurrence at 9 years with hormone therapy: 4% ?  ?Current treatment: ?1.  Adjuvant radiation started 08/18/2020 ?2. followed by adjuvant antiestrogen therapy with anastrozole 1 mg daily x 7 years started February 2022 ?  ?Anastrozole toxicities: Denies any adverse effects of anastrozole therapy.  Denies any hot flashes or arthralgias or myalgias. ? ?Breast cancer surveillance: ?1.  Breast exam 11/02/2021: Benign ?2. mammogram has been ordered. ? ?Return to clinic in 1 year for follow-up ? ? ? ?No orders of the defined types were placed in this encounter. ? ?The patient has a good understanding of the overall plan. she agrees with it. she will call with any problems that may develop before the next visit here. ?Total time spent: 30 mins including face to face time and time spent for  planning, charting and co-ordination of care ? ? Harriette Ohara, MD ?11/02/21 ? ? ? I Gardiner Coins am scribing for Dr. Lindi Adie ? ?I have reviewed the above documentation for accuracy and completeness, and I agree with the above. ?  ?

## 2021-12-03 ENCOUNTER — Ambulatory Visit: Payer: Medicare HMO | Admitting: Hematology and Oncology

## 2022-04-27 ENCOUNTER — Encounter: Payer: Self-pay | Admitting: Hematology and Oncology

## 2022-10-25 ENCOUNTER — Telehealth: Payer: Self-pay | Admitting: Family Medicine

## 2022-10-25 NOTE — Telephone Encounter (Signed)
Reached out to patient to reschedule appointment due to Gudena being out of the office 3/25, left voicemail for patient to call back.

## 2022-10-28 ENCOUNTER — Other Ambulatory Visit: Payer: Self-pay | Admitting: Hematology and Oncology

## 2022-11-07 ENCOUNTER — Ambulatory Visit: Payer: Medicare HMO | Admitting: Hematology and Oncology

## 2022-11-23 ENCOUNTER — Telehealth: Payer: Self-pay | Admitting: Podiatry

## 2022-11-23 MED ORDER — DOXYCYCLINE HYCLATE 100 MG PO TABS: 100.0000 mg | ORAL_TABLET | Freq: Two times a day (BID) | ORAL | 0 refills | Status: DC

## 2022-11-23 NOTE — Progress Notes (Signed)
Patient Care Team: Shannon Griffith, Shannon C, MD as PCP - General (Family Medicine) Shannon Griffith, Vinay, MD as Consulting Physician (Hematology and Oncology) Shannon Griffith, John, MD as Consulting Physician (Radiation Oncology) Shannon Griffith, Thomas, MD as Consulting Physician (General Surgery)  DIAGNOSIS: No diagnosis found.  SUMMARY OF ONCOLOGIC HISTORY: Oncology History  Malignant neoplasm of upper-outer quadrant of left breast in female, estrogen receptor positive  06/01/2020 Initial Diagnosis   Mammogram showed a 0.7cm left upper quadrant breast mass. 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   06/17/2020 Surgery   Bilateral lumpectomies (Shannon Griffith) [phone]([id number]):  Left lumpectomy: Grade 2 IDC 0.8 cm, ER 90%, PR 2%, Ki-67 15%, HER-2 negative, superior margin focally positive, reexcision 06/30/2020: Benign, 1 sentinel lymph node negative  Right lumpectomy: Grade 2 invasive lobular cancer too small for size to characterize it, ER 40%, PR 95%, Ki-67 2%, HER-2 0 Negative   06/24/2020 Genetic Testing   Negative genetic testing:  No pathogenic variants detected on the [facility] Common Hereditary Cancers panel. The report date is 06/24/2020.   The Common Hereditary Cancers Panel offered by [facility] includes sequencing and/or deletion duplication testing of the following 48 genes: APC, ATM, AXIN2, BARD1, BMPR1A, BRCA1, BRCA2, BRIP1, CDH1, CDK4, CDKN2A (p14ARF), CDKN2A (p16INK4a), CHEK2, CTNNA1, DICER1, EPCAM (Deletion/duplication testing only), GREM1 (promoter region deletion/duplication testing only), KIT, MEN1, MLH1, MSH2, MSH3, MSH6, MUTYH, NBN, NF1, NTHL1, PALB2, PDGFRA, PMS2, POLD1, POLE, PTEN, RAD50, RAD51C, RAD51D, RNF43, SDHB, SDHC, SDHD, SMAD4, SMARCA4. STK11, TP53, TSC1, TSC2, and VHL.  The following genes were evaluated for sequence changes only: SDHA and HOXB13 Griffith.251G>A  variant only.   06/30/2020 Surgery   Reexcision of LEFT superior, inferior, posterior, anterior, medial, lateral margins: Benign Sentinel lymph node: Negative   07/03/2020 Oncotype testing   Oncotype: Low risk: Score: 13, distant recurrence at 9 years with hormone therapy: 4%   08/17/2020 - 09/11/2020 Radiation Therapy   The patient initially received a dose of 42.56 Gy in 16 fractions to bilateral breasts using whole-breast tangent fields. This was delivered using a 3-D conformal technique. The patient then received a boost to the seroma. This delivered an additional 10 Gy in 4 fractions using a 3 field photon technique due to the depth of the seromas. The total dose was 52.56 Gy to each breast.   08/2020 - 08/2027 Anti-estrogen oral therapy   Anastrozole     CHIEF COMPLIANT:   INTERVAL HISTORY: Shannon Griffith is a   ALLERGIES:  is allergic to cephalexin, nitrofurantoin monohyd macro, and other.  MEDICATIONS:  Current Outpatient Medications  Medication Sig Dispense Refill   albuterol (VENTOLIN HFA) 108 (90 Base) MCG/ACT inhaler Inhale into the lungs.     ALPRAZolam (XANAX) 0.5 MG tablet TAKE 1/2 TO 1 TABLET BY MOUTH EVERY 6 HOURS AS NEEDED     anastrozole (ARIMIDEX) 1 MG tablet TAKE ONE TABLET BY MOUTH DAILY 90 tablet 3   buPROPion (WELLBUTRIN XL) 300 MG 24 hr tablet Take 300 mg by mouth at bedtime.  3   Cholecalciferol (VITAMIN D3) 1.25 MG (50000 UT) CAPS Take by mouth.     doxycycline (VIBRA-TABS) 100 MG tablet Take 1 tablet (100 mg total) by mouth 2 (two) times daily. 14 tablet 0   montelukast (SINGULAIR) 10 MG tablet Take by mouth.     Multiple Vitamin (MULTIVITAMIN ADULT PO) Take by mouth.     QUEtiapine  Fumarate (SEROQUEL XR) 150 MG 24 hr tablet Take 150 mg by mouth at bedtime.  3   rosuvastatin (CRESTOR) 10 MG tablet Take 10 mg by mouth at bedtime.     No current facility-administered medications for this visit.    PHYSICAL EXAMINATION: ECOG PERFORMANCE STATUS: {CHL ONC  ECOG [phone]}  There were no vitals filed for this visit. There were no vitals filed for this visit.  BREAST:*** No palpable masses or nodules in either right or left breasts. No palpable axillary supraclavicular or infraclavicular adenopathy no breast tenderness or nipple discharge. (exam performed in the presence of a chaperone)  LABORATORY DATA:  I have reviewed the data as listed    Latest Ref Rng & Units 06/16/2020   12:30 PM  CMP  Glucose 70 - 99 mg/dL 712   BUN 8 - 23 mg/dL 11   Creatinine 1.97 - 1.00 mg/dL 5.88   Sodium 325 - 498 mmol/L 141   Potassium 3.5 - 5.1 mmol/L 4.7   Chloride 98 - 111 mmol/L 104   CO2 22 - 32 mmol/L 29   Calcium 8.9 - 10.3 mg/dL 9.5   Total Protein 6.5 - 8.1 g/dL 7.3   Total Bilirubin 0.3 - 1.2 mg/dL 0.5   Alkaline Phos 38 - 126 U/L 81   AST 15 - 41 U/L 24   ALT 0 - 44 U/L 25     Lab Results  Component Value Date   WBC 4.9 06/16/2020   HGB 12.9 06/16/2020   HCT 40.3 06/16/2020   MCV 92.4 06/16/2020   PLT 264 06/16/2020   NEUTROABS 2.6 06/16/2020    ASSESSMENT & PLAN:  No problem-specific Assessment & Plan notes found for this encounter.    No orders of the defined types were placed in this encounter.  The patient has a good understanding of the overall plan. she agrees with it. she will call with any problems that may develop before the next visit here. Total time spent: 30 mins including face to face time and time spent for planning, charting and co-ordination of care   Shannon Griffith, CMA 11/23/22    I Shannon Griffith am acting as a Neurosurgeon for [employer]  ***

## 2022-11-23 NOTE — Telephone Encounter (Signed)
Patient called and stated that her toenail is almost ripped off, possible detaching from nailbed. Current has a Band-Aid on it to keep in place but it is throbbing. She wanted to know what to do in the mean time since she is scheduled for next week (requested to see Dr. Lilian Kapur). Doesn't want to get an infection.  Offered patient to come in tomorrow at 11:30 but she has another appointment somewhere else that she has had for six months.  Will patient to wait list to get in sooner if possible.  Please advise

## 2022-11-24 ENCOUNTER — Other Ambulatory Visit: Payer: Self-pay

## 2022-11-24 ENCOUNTER — Inpatient Hospital Stay: Payer: Medicare HMO | Attending: Hematology and Oncology | Admitting: Hematology and Oncology

## 2022-11-24 VITALS — BP 151/65 | HR 76 | Temp 97.5°F | Resp 18 | Ht 66.0 in | Wt 163.1 lb

## 2022-11-24 DIAGNOSIS — Z79811 Long term (current) use of aromatase inhibitors: Secondary | ICD-10-CM | POA: Diagnosis not present

## 2022-11-24 DIAGNOSIS — C50412 Malignant neoplasm of upper-outer quadrant of left female breast: Secondary | ICD-10-CM | POA: Diagnosis present

## 2022-11-24 DIAGNOSIS — Z170 Estrogen receptor positive status [ER+]: Secondary | ICD-10-CM | POA: Diagnosis not present

## 2022-11-24 NOTE — Assessment & Plan Note (Signed)
Mammogram showed a 0.7cm left upper quadrant breast mass. Biopsy on 05/20/20 showed columnar cell hyperplasia in the right breast, and in the left breast, invasive ductal carcinoma, grade 3, HER-2 equivocal by IHC, negative by FISH (ratio 1.37), ER+ 90%, PR+ 2%, Ki67 15%   Bilateral lumpectomies: Left lumpectomy: Grade 2 IDC 0.8 cm, ER 90%, PR 2%, Ki-67 15%, HER-2 negative, superior margin focally positive, reexcision 06/30/2020: Benign, 1 sentinel lymph node negative Right lumpectomy: Grade 2 invasive lobular cancer too small for size to characterize it, ER 40%, PR 95%, Ki-67 2%, HER-2 0 Negative   Oncotype: Low risk: Score: 13, distant recurrence at 9 years with hormone therapy: 4%   Current treatment: 1.  Adjuvant radiation started 08/18/2020 2. followed by adjuvant antiestrogen therapy with anastrozole 1 mg daily x 7 years started February 2022   Anastrozole toxicities: Denies any adverse effects of anastrozole therapy.  Denies any hot flashes or arthralgias or myalgias.   Breast cancer surveillance: 1.  Breast exam 11/24/2022: Benign 2. mammogram 04/27/2022 at [facility]: Benign breast density category B   Return to clinic in 1 year for follow-up

## 2022-12-01 ENCOUNTER — Ambulatory Visit: Payer: Medicare HMO | Admitting: Podiatry

## 2022-12-01 DIAGNOSIS — L601 Onycholysis: Secondary | ICD-10-CM | POA: Diagnosis not present

## 2022-12-04 NOTE — Progress Notes (Signed)
  Subjective:  Patient ID: Shannon Griffith, female    DOB: 1955-11-16,  MRN: [id number]  Chief Complaint  Patient presents with   Nail Problem    Left foot Great toe nail was torn off.    67 y.o. female presents with the above complaint. History confirmed with patient.  Does not recall any inciting traumatic event but has become quite loose and lifting off, looks like the new nail is growing under  Objective:  Physical Exam: warm, good capillary refill, no trophic changes or ulcerative lesions, normal DP and PT pulses, normal sensory exam, and left hallux distal 50% of nail plate has onycholysis, and new healthy nail growing proximal to this Assessment:   1. Onycholysis      Plan:  Patient was evaluated and treated and all questions answered.  We discussed possible etiologies of onycholysis discussed that likely this is secondary to some sort of occult trauma.  Appears to be healing well.  I removed the loose portions of the nail plate this did require local digital block on the medial side.  Entirety of the nail plate did not need to be removed.  Post care instructions given.  Return as needed if further issues  No follow-ups on file.

## 2023-06-07 ENCOUNTER — Ambulatory Visit
Admission: RE | Admit: 2023-06-07 | Discharge: 2023-06-07 | Disposition: A | Discharge status: Home / Self Care | Payer: Medicare HMO | Source: Ambulatory Visit | Attending: Medical | Admitting: Medical

## 2023-06-07 ENCOUNTER — Encounter: Payer: Self-pay | Admitting: Medical

## 2023-06-07 ENCOUNTER — Other Ambulatory Visit: Payer: Self-pay | Admitting: Medical

## 2023-06-07 DIAGNOSIS — M5451 Vertebrogenic low back pain: Secondary | ICD-10-CM

## 2023-10-02 ENCOUNTER — Telehealth: Payer: Self-pay | Admitting: Hematology and Oncology

## 2023-10-02 NOTE — Telephone Encounter (Signed)
 Rescheduled appointment per patients request via incoming call. Patient is aware of the changes made to her upcoming appointment.

## 2023-11-01 ENCOUNTER — Other Ambulatory Visit: Payer: Self-pay | Admitting: Hematology and Oncology

## 2023-11-27 ENCOUNTER — Ambulatory Visit: Payer: Medicare HMO | Admitting: Hematology and Oncology

## 2023-12-05 ENCOUNTER — Inpatient Hospital Stay: Payer: Medicare HMO | Attending: Hematology and Oncology | Admitting: Hematology and Oncology

## 2023-12-05 VITALS — BP 137/74 | HR 79 | Temp 98.3°F | Ht 66.0 in | Wt 165.5 lb

## 2023-12-05 DIAGNOSIS — Z923 Personal history of irradiation: Secondary | ICD-10-CM | POA: Insufficient documentation

## 2023-12-05 DIAGNOSIS — Z1732 Human epidermal growth factor receptor 2 negative status: Secondary | ICD-10-CM | POA: Diagnosis not present

## 2023-12-05 DIAGNOSIS — C50412 Malignant neoplasm of upper-outer quadrant of left female breast: Secondary | ICD-10-CM

## 2023-12-05 DIAGNOSIS — Z1721 Progesterone receptor positive status: Secondary | ICD-10-CM | POA: Insufficient documentation

## 2023-12-05 DIAGNOSIS — Z170 Estrogen receptor positive status [ER+]: Secondary | ICD-10-CM | POA: Diagnosis not present

## 2023-12-05 NOTE — Progress Notes (Signed)
 Patient Care Team: Emaline Handsome, MD as PCP - General (Family Medicine) Cameron Cea, MD as Consulting Physician (Hematology and Oncology) Johna Myers, MD as Consulting Physician (Radiation Oncology) Sim Dryer, MD as Consulting Physician (General Surgery)  DIAGNOSIS:  Encounter Diagnosis  Name Primary?   Malignant neoplasm of upper-outer quadrant of left breast in female, estrogen receptor positive (HCC) Yes    SUMMARY OF ONCOLOGIC HISTORY: Oncology History  Malignant neoplasm of upper-outer quadrant of left breast in female, estrogen receptor positive (HCC)  06/01/2020 Initial Diagnosis   Mammogram showed a 0.7cm left upper quadrant breast mass. 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   06/17/2020 Surgery   Bilateral lumpectomies ([facility]) [phone]):  Left lumpectomy: Grade 2 IDC 0.8 cm, ER 90%, PR 2%, Ki-67 15%, HER-2 negative, superior margin focally positive, reexcision 06/30/2020: Benign, 1 sentinel lymph node negative  Right lumpectomy: Grade 2 invasive lobular cancer too small for size to characterize it, ER 40%, PR 95%, Ki-67 2%, HER-2 0 Negative   06/24/2020 Genetic Testing   Negative genetic testing:  No pathogenic variants detected on the [facility] Common Hereditary Cancers panel. The report date is 06/24/2020.   The Common Hereditary Cancers Panel offered by [facility] includes sequencing and/or deletion duplication testing of the following 48 genes: APC, ATM, AXIN2, BARD1, BMPR1A, BRCA1, BRCA2, BRIP1, CDH1, CDK4, CDKN2A (p14ARF), CDKN2A (p16INK4a), CHEK2, CTNNA1, DICER1, EPCAM (Deletion/duplication testing only), GREM1 (promoter region deletion/duplication testing only), KIT, MEN1, MLH1, MSH2, MSH3, MSH6, MUTYH, NBN, NF1, NTHL1, PALB2, PDGFRA, PMS2, POLD1, POLE, PTEN, RAD50, RAD51C, RAD51D, RNF43, SDHB, SDHC, SDHD, SMAD4,  SMARCA4. STK11, TP53, TSC1, TSC2, and VHL.  The following genes were evaluated for sequence changes only: SDHA and HOXB13 c.251G>A variant only.   06/30/2020 Surgery   Reexcision of LEFT superior, inferior, posterior, anterior, medial, lateral margins: Benign Sentinel lymph node: Negative   07/03/2020 Oncotype testing   Oncotype: Low risk: Score: 13, distant recurrence at 9 years with hormone therapy: 4%   08/17/2020 - 09/11/2020 Radiation Therapy   The patient initially received a dose of 42.56 Gy in 16 fractions to bilateral breasts using whole-breast tangent fields. This was delivered using a 3-D conformal technique. The patient then received a boost to the seroma. This delivered an additional 10 Gy in 4 fractions using a 3 field photon technique due to the depth of the seromas. The total dose was 52.56 Gy to each breast.   08/2020 - 08/2027 Anti-estrogen oral therapy   Anastrozole      CHIEF COMPLIANT: Follow-up on anastrozole  therapy  HISTORY OF PRESENT ILLNESS: History of Present Illness The patient, with a history of bilateral breast cancer, presents for a routine follow-up. She reports a significant fall last October, resulting in a compression fracture of the L5 vertebra. Despite two months of physical therapy, the healing process has been slow and she continues to experience limitations in her physical activity, particularly when interacting with her grandchildren. She denies any surgical intervention for the fracture.  Regarding her breast cancer, she has been on anastrozole  for three years and tolerates the medication well. She expresses curiosity about the duration of this treatment, which the doctor explains is typically between five to seven years, depending on individual risk and tolerance. She also expresses concern about having blood pressure measurements and blood draws on the arms where lymph nodes were removed due to  her breast cancer surgeries.  The patient also mentions a  recent milestone birthday and the challenges of aging, including caring for her 30 year old mother. She expresses frustration with her current physical limitations due to the back injury, but remains hopeful for future improvement.     ALLERGIES:  is allergic to cephalexin, nitrofurantoin monohyd macro, and other.  MEDICATIONS:  Current Outpatient Medications  Medication Sig Dispense Refill   anastrozole  (ARIMIDEX ) 1 MG tablet TAKE 1 TABLET BY MOUTH DAILY 90 tablet 3   buPROPion  (WELLBUTRIN  XL) 300 MG 24 hr tablet Take 300 mg by mouth at bedtime.  3   Cholecalciferol (VITAMIN D3) 1.25 MG (50000 UT) CAPS Take by mouth.     Multiple Vitamin (MULTIVITAMIN ADULT PO) Take by mouth.     QUEtiapine  Fumarate (SEROQUEL  XR) 150 MG 24 hr tablet Take 150 mg by mouth at bedtime.  3   rosuvastatin (CRESTOR) 10 MG tablet Take 10 mg by mouth at bedtime.     sulfamethoxazole-trimethoprim (BACTRIM DS) 800-160 MG tablet      albuterol (VENTOLIN HFA) 108 (90 Base) MCG/ACT inhaler Inhale into the lungs. (Patient not taking: Reported on 12/05/2023)     ALPRAZolam (XANAX) 0.5 MG tablet TAKE 1/2 TO 1 TABLET BY MOUTH EVERY 6 HOURS AS NEEDED (Patient not taking: Reported on 12/05/2023)     montelukast (SINGULAIR) 10 MG tablet Take by mouth. (Patient not taking: Reported on 12/05/2023)     No current facility-administered medications for this visit.    PHYSICAL EXAMINATION: ECOG PERFORMANCE STATUS: 1 - Symptomatic but completely ambulatory  Vitals:   12/05/23 1107  BP: 137/74  Pulse: 79  Temp: 98.3 F (36.8 C)  SpO2: 98%   Filed Weights   12/05/23 1107  Weight: 165 lb 8 oz (75.1 kg)    LABORATORY DATA:  I have reviewed the data as listed    Latest Ref Rng & Units 06/16/2020   12:30 PM  CMP  Glucose 70 - 99 mg/dL 469   BUN 8 - 23 mg/dL 11   Creatinine 6.29 - 1.00 mg/dL 5.28   Sodium 413 - 244 mmol/L 141   Potassium 3.5 - 5.1 mmol/L 4.7   Chloride 98 - 111 mmol/L 104   CO2 22 - 32 mmol/L 29    Calcium 8.9 - 10.3 mg/dL 9.5   Total Protein 6.5 - 8.1 g/dL 7.3   Total Bilirubin 0.3 - 1.2 mg/dL 0.5   Alkaline Phos 38 - 126 U/L 81   AST 15 - 41 U/L 24   ALT 0 - 44 U/L 25     Lab Results  Component Value Date   WBC 4.9 06/16/2020   HGB 12.9 06/16/2020   HCT 40.3 06/16/2020   MCV 92.4 06/16/2020   PLT 264 06/16/2020   NEUTROABS 2.6 06/16/2020    ASSESSMENT & PLAN:  Malignant neoplasm of upper-outer quadrant of left breast in female, estrogen receptor positive (HCC) Mammogram showed a 0.7cm left upper quadrant breast mass. Biopsy on 05/20/20 showed columnar cell hyperplasia in the right breast, and in the left breast, invasive ductal carcinoma, grade 3, HER-2 equivocal by IHC, negative by FISH (ratio 1.37), ER+ 90%, PR+ 2%, Ki67 15%   Bilateral lumpectomies: Left lumpectomy: Grade 2 IDC 0.8 cm, ER 90%, PR 2%, Ki-67 15%, HER-2 negative, superior margin focally positive, reexcision 06/30/2020: Benign, 1 sentinel lymph node negative Right lumpectomy: Grade 2 invasive lobular cancer too small for size to characterize it, ER 40%, PR 95%, Ki-67 2%, HER-2 0  Negative   Oncotype: Low risk: Score: 13, distant recurrence at 9 years with hormone therapy: 4%   Current treatment: 1.  Adjuvant radiation started 08/18/2020 2. followed by adjuvant antiestrogen therapy with anastrozole  1 mg daily x 7 years started February 2022   Anastrozole  toxicities: Denies any adverse effects of anastrozole  therapy.  Denies any hot flashes or arthralgias or myalgias.   Breast cancer surveillance: 1.  Breast exam 12/05/2023: Benign 2. mammogram [facility]/02/2023 at [facility]: Benign breast density category B   Return to clinic in 1 year for follow-up   Assessment & Plan    Compression fracture of L5 vertebra Compression fracture of L5 from fall last October, healing slowly without surgical intervention. Completed two months of physical therapy.      No orders of the defined types were placed in this  encounter.  The patient has a good understanding of the overall plan. she agrees with it. she will call with any problems that may develop before the next visit here. Total time spent: 30 mins including face to face time and time spent for planning, charting and co-ordination of care   Margert Sheerer, MD 12/05/23

## 2023-12-05 NOTE — Assessment & Plan Note (Signed)
 Mammogram showed a 0.7cm left upper quadrant breast mass. Biopsy on 05/20/20 showed columnar cell hyperplasia in the right breast, and in the left breast, invasive ductal carcinoma, grade 3, HER-2 equivocal by IHC, negative by FISH (ratio 1.37), ER+ 90%, PR+ 2%, Ki67 15%   Bilateral lumpectomies: Left lumpectomy: Grade 2 IDC 0.8 cm, ER 90%, PR 2%, Ki-67 15%, HER-2 negative, superior margin focally positive, reexcision 06/30/2020: Benign, 1 sentinel lymph node negative Right lumpectomy: Grade 2 invasive lobular cancer too small for size to characterize it, ER 40%, PR 95%, Ki-67 2%, HER-2 0 Negative   Oncotype: Low risk: Score: 13, distant recurrence at 9 years with hormone therapy: 4%   Current treatment: 1.  Adjuvant radiation started 08/18/2020 2. followed by adjuvant antiestrogen therapy with anastrozole  1 mg daily x 7 years started February 2022   Anastrozole  toxicities: Denies any adverse effects of anastrozole  therapy.  Denies any hot flashes or arthralgias or myalgias.   Breast cancer surveillance: 1.  Breast exam 12/05/2023: Benign 2. mammogram 05/02/2023 at [facility]: Benign breast density category B   Return to clinic in 1 year for follow-up

## 2024-07-08 ENCOUNTER — Ambulatory Visit: Admitting: Family Medicine

## 2024-07-08 ENCOUNTER — Encounter: Payer: Self-pay | Admitting: Family Medicine

## 2024-07-08 VITALS — BP 118/84 | Ht 66.0 in | Wt 150.0 lb

## 2024-07-08 DIAGNOSIS — S32050S Wedge compression fracture of fifth lumbar vertebra, sequela: Secondary | ICD-10-CM | POA: Diagnosis not present

## 2024-07-08 DIAGNOSIS — Z853 Personal history of malignant neoplasm of breast: Secondary | ICD-10-CM | POA: Diagnosis not present

## 2024-07-08 DIAGNOSIS — S22070A Wedge compression fracture of T9-T10 vertebra, initial encounter for closed fracture: Secondary | ICD-10-CM

## 2024-07-08 DIAGNOSIS — M8000XA Age-related osteoporosis with current pathological fracture, unspecified site, initial encounter for fracture: Secondary | ICD-10-CM | POA: Diagnosis not present

## 2024-07-08 NOTE — Progress Notes (Signed)
 DATE OF VISIT: 07/08/2024        Shannon Griffith DOB: Apr 22, 1956 MRN: [id number]  Discussed the use of AI scribe software for clinical note transcription with the patient, who gave verbal consent to proceed.  History of Present Illness Shannon Griffith is a 67 year old female presents today with her husband for osteoporosis evaluation - Referred by Dr. Orpha at [facility] orthopedics - History significant for T10 and L5 compression fractures in the past  Thoracic back pain and vertebral compression fracture - Persistent, significant back pain since June 3rd following a T10 compression fracture - Pain has not improved with physical therapy - Insurance declined coverage for kyphoplasty - Upcoming appointment with spine specialist (Dr. Smith) at [facility] in early December  History of vertebral compression fractures - Previous compression fracture in the lower back one year ago (L5 compression fracture 05/2023) - Fracture healed over seven to eight months with physical therapy and without surgical intervention  Osteoporosis management - Osteoporosis managed with [facility] for seven years - Calcium and vitamin D supplementation ongoing - Vitamin D levels previously borderline - No prior osteoporosis medications before [facility] - Last bone density scan performed over one year ago; no subsequent scans  Breast cancer and hormonal therapy - History of breast cancer diagnosed four years ago - Treated with four weeks of radiation therapy - Currently on anastrozole  for estrogen suppression - History of hysterectomy and bladder surgery in early sixties (seven to eight years ago) - No history of hormone replacement therapy  Functional status and activity level - Very active lifestyle, caring for three grandchildren (ages four, 30, and ten) and 70 year old mother - Frequent stair use, contributing to weight loss  Social and lifestyle factors - No tobacco use - Occasional  alcohol consumption  Family history of fragility fractures - Father sustained a back fracture from a treadmill fall in his early 47 - Mother sustained a hip fracture from a fall three to four years ago   Prior treatment: Ibandronate Shannon Griffith) History of Hip, Spine, or Wrist Fracture: YES - T10 compression fx 05/2023 - L5 compression fx 01/2024 Heart disease or stroke: no Cancer: YES - breast CA Kidney Disease: no Gastric/Peptic Ulcer: no Gastric bypass surgery: no Severe GERD: no History of seizures: no Age at Menopause: 68yo Hysterectomy: Yes Calcium intake: Calcium Citrate 500mg  tid (1500mg /daily) Vitamin D intake: D3 2000 international units daily Hormone replacement therapy: no -Currently on anastrozole  for estrogen suppression for her history of breast cancer Smoking history: never Alcohol: 1-2 drinks Exercise: PT daily Major dental work in past year: no Parents with hip/spine fracture: YES -above  DEXA scan 11/09/2022 at [facility] showing: - T-score -2.5 consistent with osteoporosis  Labs completed 07/02/2024 and personally reviewed and interpreted by me today showing: - Normal CMP with calcium 9.6, normal renal function PTH normal at 38 TSH normal at 3.36 CBC with slightly low hemoglobin 11.5, otherwise normal - Vitamin D normal at 30 SPEP pending  Medications:  Outpatient Encounter Medications as of 07/08/2024  Medication Sig   albuterol (VENTOLIN HFA) 108 (90 Base) MCG/ACT inhaler Inhale into the lungs. (Patient not taking: Reported on 12/05/2023)   ALPRAZolam (XANAX) 0.5 MG tablet TAKE 1/2 TO 1 TABLET BY MOUTH EVERY 6 HOURS AS NEEDED (Patient not taking: Reported on 12/05/2023)   anastrozole  (ARIMIDEX ) 1 MG tablet TAKE 1 TABLET BY MOUTH DAILY   buPROPion  (WELLBUTRIN  XL) 300 MG 24 hr tablet Take 300 mg by mouth at bedtime.   Cholecalciferol (VITAMIN  D3) 1.25 MG (50000 UT) CAPS Take by mouth.   montelukast (SINGULAIR) 10 MG tablet Take by mouth. (Patient not  taking: Reported on 12/05/2023)   Multiple Vitamin (MULTIVITAMIN ADULT PO) Take by mouth.   QUEtiapine  Fumarate (SEROQUEL  XR) 150 MG 24 hr tablet Take 150 mg by mouth at bedtime.   rosuvastatin (CRESTOR) 10 MG tablet Take 10 mg by mouth at bedtime.   sulfamethoxazole-trimethoprim (BACTRIM DS) 800-160 MG tablet    No facility-administered encounter medications on file as of 07/08/2024.    Allergies: is allergic to cephalexin, nitrofurantoin monohyd macro, and other.  Physical Examination: Vitals: BP 118/84   Ht 5' 6 (1.676 m)   Wt 150 lb (68 kg)   BMI 24.21 kg/m  Griffith:  Shannon Griffith is a 68 y.o. female appearing their stated age, alert and oriented x 3, in no apparent distress.  SKIN: no rashes or lesions, skin clean, dry, intact MSK: Seated comfortably in exam room table.  Normal gross upper extremity and lower extremity strength.  Normal gait PSYCH: Appropriate mood, answering questions appropriately, good insight  Radiology: DEXA scan 11/09/2022 at [facility] showing: - T-score -2.5 consistent with osteoporosis  LABORATORY RESULTS: Labs completed 07/02/2024 and personally reviewed and interpreted by me today showing: - Normal CMP with calcium 9.6, normal renal function PTH normal at 38 TSH normal at 3.36 CBC with slightly low hemoglobin 11.5, otherwise normal - Vitamin D normal at 30 SPEP pending  Assessment & Plan Severe osteoporosis with history of multiple vertebral compression fractures - T10 01/2024, L5 05/2023 Severe osteoporosis with vertebral compression fractures at T10 and L5. [employer] declined kyphoplasty.  - Previous labs, DEXA, notes from Dr. Orpha reviewed during visit today -Will reach out with SPEP results once available, those are still pending - Candidate for anabolic medication such as Evenity to help increase bone density. Discussed risks of osteonecrosis of the jaw. Designer, fashion/clothing for Shannon Griffith. Evenity preferred due to fewer injections and better  efficacy. - If Evenity is approved, administer monthly injections for 12 months. - If Shannon Griffith is denied, consider Tymlos or Forteo. - Transition to Prolia after Evenity treatment.  Reviewed that Prolia injections would be once every 6 months - Repeat bone density scan one month after completing Evenity treatment. - Continue calcium and vitamin D supplementation as she is doing - Continue weightbearing activity as she is doing  Personal history of breast cancer, status post radiation therapy Breast cancer treated with radiation therapy four years ago. Currently on anastrozole . Discussed impact of anastrozole  on bone health and prioritization of breast cancer prevention. - Continue anastrozole  for breast cancer prevention.  Vitamin D insufficiency, borderline Vitamin D level at low end of normal. Good adherence to supplements. - Continue calcium and vitamin D supplementation.   Patient expressed understanding & agreement with above.  Encounter Diagnoses  Name Primary?   Age-related osteoporosis with current pathological fracture, initial encounter Yes   Closed wedge compression fracture of T10 vertebra, initial encounter (HCC)    Closed compression fracture of L5 lumbar vertebra, sequela    History of bilateral breast cancer     No orders of the defined types were placed in this encounter.    Contains text generated by [employer].

## 2024-07-08 NOTE — Patient Instructions (Addendum)
 Osteoporosis Work-up and Treatment  Labwork: Reviewed your previous lab work including CMP, CBC with diff, 25-OH Vit D, TSH, PTH all were unremarkable Your SPEP is still pending - this can take up to 2 weeks, we will let you know once we have those results  Supplementation: Make sure you're getting 1200mg  calcium and 800 IU of vitamin D daily -- you are already getting the recommended dosage, you should continue this  Exercise: Strength/resistance-based exercise (i.e. free weights, lifting); weight-based (yoga, tai-chi, pilates)  Medication Options: Evenity - this builds bone, is an injection once a month for 12 months - after you finish this you would go on a medicine that keeps it built up (Prolia or fosamax).  If Evenity is not approved, we can use Tymlos or Forteo which are daily injections that you do for 2 years.  These also build bone.  You then transition to Prolia after that. Prolia - injection that just maintains bone - given every 6 months here in the office

## 2024-07-09 ENCOUNTER — Telehealth: Payer: Self-pay | Admitting: *Deleted

## 2024-07-09 NOTE — Telephone Encounter (Addendum)
 [facility] [employer] [facility] [employer]  Patient is ready for scheduling on or after: 09/05/24  Out-of-pocket cost due at time of visit: $533  Primary: H[employer] Evenity  co-insurance: 20% (approximately $508) Office visit co-pay: $25   Deductible: n/a  Prior Auth: not req'd  HTA ph #: [phone]; spoke to Julissa.  ** This summary of benefits is an estimation of the patient's out-of-pocket cost. Exact cost may vary based on individual plan coverage.

## 2024-08-05 LAB — COLOGUARD: COLOGUARD: POSITIVE — AB

## 2024-09-05 ENCOUNTER — Other Ambulatory Visit: Payer: Self-pay | Admitting: *Deleted

## 2024-09-05 MED ORDER — EVENITY 105 MG/1.17ML ~~LOC~~ SOSY
210.0000 mg | PREFILLED_SYRINGE | Freq: Once | SUBCUTANEOUS | 0 refills | Status: DC
  Filled 2024-09-12: qty 3.51, 1d supply, fill #0

## 2024-09-10 ENCOUNTER — Other Ambulatory Visit: Payer: Self-pay

## 2024-09-10 ENCOUNTER — Telehealth: Payer: Self-pay

## 2024-09-10 NOTE — Telephone Encounter (Signed)
 Pharmacy Patient Advocate Encounter  Insurance verification completed.   The patient is insured through [facility] ADVANTAGE/RX ADVANCE   Ran test claim for Evenity . Co-pay is $1,088.91   This test claim was processed through [facility]- copay amounts may vary at other pharmacies due to pharmacy/plan contracts, or as the patient moves through the different stages of their insurance plan.

## 2024-09-12 ENCOUNTER — Other Ambulatory Visit: Payer: Self-pay | Admitting: *Deleted

## 2024-09-12 ENCOUNTER — Other Ambulatory Visit: Payer: Self-pay

## 2024-09-12 DIAGNOSIS — M810 Age-related osteoporosis without current pathological fracture: Secondary | ICD-10-CM

## 2024-09-12 MED ORDER — TYMLOS 3120 MCG/1.56ML ~~LOC~~ SOPN: 80.0000 ug | PEN_INJECTOR | Freq: Every day | SUBCUTANEOUS | 12 refills | Status: DC

## 2024-09-18 ENCOUNTER — Ambulatory Visit: Admitting: Family Medicine

## 2024-09-18 VITALS — Ht 66.0 in

## 2024-09-18 DIAGNOSIS — M810 Age-related osteoporosis without current pathological fracture: Secondary | ICD-10-CM

## 2024-09-18 MED ORDER — ROMOSOZUMAB-AQQG 105 MG/1.17ML ~~LOC~~ SOSY
210.0000 mg | PREFILLED_SYRINGE | Freq: Once | SUBCUTANEOUS | Status: AC
  Administered 2024-09-18: 210 mg via SUBCUTANEOUS

## 2024-09-18 NOTE — Progress Notes (Signed)
 Patient is here for evenity  injection #1. Patient received bilateral arm [place] evenity  injections today. She tolerated injections well.   Pt waited 15 min post injection to ensure no adverse reaction.  She will return in 1 month for her next evenity  injection.

## 2024-10-17 ENCOUNTER — Ambulatory Visit: Admitting: Family Medicine

## 2024-12-05 ENCOUNTER — Inpatient Hospital Stay: Admitting: Hematology and Oncology
